# Patient Record
Sex: Male | Born: 1950 | Race: White | Hispanic: No | Marital: Married | Smoking: Former smoker
Health system: Southern US, Community
[De-identification: ages and names within clinical notes are randomized; demographics above are authoritative.]

## PROBLEM LIST (undated history)

## (undated) DIAGNOSIS — K219 Gastro-esophageal reflux disease without esophagitis: Secondary | ICD-10-CM

## (undated) DIAGNOSIS — E785 Hyperlipidemia, unspecified: Secondary | ICD-10-CM

## (undated) DIAGNOSIS — I10 Essential (primary) hypertension: Secondary | ICD-10-CM

## (undated) HISTORY — PX: TONSILLECTOMY: SUR1361

## (undated) HISTORY — PX: INGUINAL HERNIA REPAIR: SHX194

## (undated) HISTORY — DX: Hyperlipidemia, unspecified: E78.5

---

## 2005-11-30 ENCOUNTER — Ambulatory Visit (HOSPITAL_COMMUNITY): Admission: RE | Admit: 2005-11-30 | Discharge: 2005-11-30 | Payer: Self-pay | Admitting: Gastroenterology

## 2005-11-30 ENCOUNTER — Encounter (INDEPENDENT_AMBULATORY_CARE_PROVIDER_SITE_OTHER): Payer: Self-pay | Admitting: Specialist

## 2007-12-11 ENCOUNTER — Encounter: Admission: RE | Admit: 2007-12-11 | Discharge: 2007-12-11 | Payer: Self-pay | Admitting: Internal Medicine

## 2011-04-01 NOTE — Op Note (Signed)
Joseph Steele, Joseph Steele                  ACCOUNT NO.:  [id number]   MEDICAL RECORD NO.:  [id number]          PATIENT TYPE:  AMB   LOCATION:  ENDO                         FACILITY:  [facility]   PHYSICIAN:  Danise Edge, M.D.   DATE OF BIRTH:  November 29, 1950   DATE OF PROCEDURE:  11/30/2005  DATE OF DISCHARGE:                                 OPERATIVE REPORT   PROCEDURE:  Screening colonoscopy with polypectomy.   INDICATIONS FOR PROCEDURE:  Mr. Hines Kloss is a 60 year old male born  10-11-1951. Mr. Ellingson is scheduled to undergo his first screening  colonoscopy with polypectomy to prevent colon cancer.   ENDOSCOPIST:  Danise Edge, M.D.   PREMEDICATION:  Versed 7 mg, Demerol 70 mg.   PROCEDURE:  After obtaining informed consent, Mr. Snellings was placed in the  left lateral decubitus position. I administered intravenous Demerol and  intravenous Versed to achieve conscious sedation for the procedure. The  patient's blood pressure, oxygen saturation and cardiac rhythm were  monitored throughout the procedure and documented in the medical record.   Anal inspection and digital rectal exam were normal. The prostate was non-  nodular. The Olympus adjustable pediatric colonoscope was introduced into  the rectum and advanced to the cecum. A normal-appearing ileocecal valve and  appendiceal orifice were identified. Colonic preparation exam today was  excellent.   Rectum normal. I was unable to perform a retroflexed view of the distal  rectum.  Sigmoid colon and descending colon. At 45 cm from the anal verge a 2 mm  sessile polyp was removed with the cold biopsy forceps.  Splenic flexure normal.  Transverse colon normal.  Hepatic flexure normal.  Ascending colon normal.  Cecum and ileocecal valve. From the base of the cecum, a 5 mm sessile polyp  was removed with electrocautery snare.   ASSESSMENT:  A 5 mm polyp was removed from the base of the cecum and a 2 mm  polyp removed from the sigmoid  colon.           ______________________________  Danise Edge, M.D.     MJ/MEDQ  D:  11/30/2005  T:  11/30/2005  Job:  [id number]   cc:   Theressa Millard, M.D.  Fax: [phone]

## 2011-07-04 ENCOUNTER — Other Ambulatory Visit: Payer: Self-pay | Admitting: Dermatology

## 2011-07-11 ENCOUNTER — Other Ambulatory Visit: Payer: Self-pay | Admitting: Dermatology

## 2012-08-04 ENCOUNTER — Encounter (HOSPITAL_COMMUNITY): Payer: Self-pay | Admitting: Emergency Medicine

## 2012-08-04 ENCOUNTER — Emergency Department (HOSPITAL_COMMUNITY)
Admission: EM | Admit: 2012-08-04 | Discharge: 2012-08-04 | Disposition: A | Discharge status: Home / Self Care | Payer: 59 | Attending: Emergency Medicine | Admitting: Emergency Medicine

## 2012-08-04 ENCOUNTER — Emergency Department (HOSPITAL_COMMUNITY): Payer: 59

## 2012-08-04 DIAGNOSIS — E119 Type 2 diabetes mellitus without complications: Secondary | ICD-10-CM | POA: Insufficient documentation

## 2012-08-04 DIAGNOSIS — S61209A Unspecified open wound of unspecified finger without damage to nail, initial encounter: Secondary | ICD-10-CM | POA: Insufficient documentation

## 2012-08-04 DIAGNOSIS — S62609B Fracture of unspecified phalanx of unspecified finger, initial encounter for open fracture: Secondary | ICD-10-CM

## 2012-08-04 DIAGNOSIS — Y93H2 Activity, gardening and landscaping: Secondary | ICD-10-CM | POA: Insufficient documentation

## 2012-08-04 DIAGNOSIS — W298XXA Contact with other powered powered hand tools and household machinery, initial encounter: Secondary | ICD-10-CM | POA: Insufficient documentation

## 2012-08-04 DIAGNOSIS — I10 Essential (primary) hypertension: Secondary | ICD-10-CM | POA: Insufficient documentation

## 2012-08-04 DIAGNOSIS — Y92009 Unspecified place in unspecified non-institutional (private) residence as the place of occurrence of the external cause: Secondary | ICD-10-CM | POA: Insufficient documentation

## 2012-08-04 DIAGNOSIS — Z79899 Other long term (current) drug therapy: Secondary | ICD-10-CM | POA: Insufficient documentation

## 2012-08-04 HISTORY — DX: Essential (primary) hypertension: I10

## 2012-08-04 MED ORDER — LIDOCAINE HCL 2 % IJ SOLN
5.0000 mL | Freq: Once | INTRAMUSCULAR | Status: AC
  Administered 2012-08-04: 20 mg
  Filled 2012-08-04: qty 20

## 2012-08-04 MED ORDER — BACITRACIN 500 UNIT/GM EX OINT
1.0000 "application " | TOPICAL_OINTMENT | Freq: Two times a day (BID) | CUTANEOUS | Status: DC
  Administered 2012-08-04: 1 via TOPICAL
  Filled 2012-08-04: qty 0.9

## 2012-08-04 MED ORDER — CEPHALEXIN 500 MG PO CAPS: 1000.0000 mg | ORAL_CAPSULE | Freq: Two times a day (BID) | ORAL | Status: DC

## 2012-08-04 MED ORDER — TETANUS-DIPHTH-ACELL PERTUSSIS 5-2.5-18.5 LF-MCG/0.5 IM SUSP
0.5000 mL | Freq: Once | INTRAMUSCULAR | Status: AC
  Administered 2012-08-04: 0.5 mL via INTRAMUSCULAR
  Filled 2012-08-04: qty 0.5

## 2012-08-04 MED ORDER — CEFAZOLIN SODIUM 1-5 GM-% IV SOLN
1.0000 g | Freq: Once | INTRAVENOUS | Status: AC
  Administered 2012-08-04: 1 g via INTRAVENOUS
  Filled 2012-08-04: qty 50

## 2012-08-04 MED ORDER — TRAMADOL HCL 50 MG PO TABS: 50.0000 mg | ORAL_TABLET | Freq: Four times a day (QID) | ORAL | Status: DC | PRN

## 2012-08-04 NOTE — ED Notes (Signed)
Pt trimming hedges this a.m. And sustained laceration to left long finger. Went to [facility] and was sent here for care. Laceration is thru the entire nail bed

## 2012-08-04 NOTE — ED Provider Notes (Signed)
History     CSN: [id number]  Arrival date & time 08/04/12  1408   First MD Initiated Contact with Patient 08/04/12 1529      Chief Complaint  Patient presents with  . Extremity Laceration    (Consider location/radiation/quality/duration/timing/severity/associated sxs/prior treatment) The history is provided by the patient.   61 y.o. male in no acute distress presenting with left middle finger laceration sustained this a.m. after he his hand on a hedge trimmer. Patient is unsure of his last tetanus shot. Bleeding is controlled. Pain is minimal at 3/10. He reports no loss of range of motion. He reports he was holding the hedge trimmer in his left hand (he is left-hand dominant and has carpal tunnel) he was holding the hedge trimmer single handed and it slipped.    Past Medical History  Diagnosis Date  . Diabetes mellitus   . Hypertension     Past Surgical History  Procedure Date  . Tonsillectomy   . Inguinal hernia repair     right at 61 years of age    No family history on file.  History  Substance Use Topics  . Smoking status: Former Games developer  . Smokeless tobacco: Never Used  . Alcohol Use: 8.0 oz/week    16 drink(s) per week     [id number]      Review of Systems  Constitutional: Negative for fever.  Respiratory: Negative for shortness of breath.   Cardiovascular: Negative for chest pain.  Gastrointestinal: Negative for nausea, vomiting, abdominal pain and diarrhea.  All other systems reviewed and are negative.    Allergies  Review of patient's allergies indicates no known allergies.  Home Medications   Current Outpatient Rx  Name Route Sig Dispense Refill  . AMLODIPINE BESYLATE 10 MG PO TABS Oral Take 10 mg by mouth daily.    Marland Kitchen HYDROCHLOROTHIAZIDE 25 MG PO TABS Oral Take 12.5 mg by mouth daily.    Marland Kitchen LISINOPRIL 40 MG PO TABS Oral Take 40 mg by mouth daily.    Marland Kitchen METFORMIN HCL 500 MG PO TABS Oral Take 500 mg by mouth 2 (two) times daily with a meal.    .  OMEPRAZOLE MAGNESIUM 20 MG PO TBEC Oral Take 20 mg by mouth daily.    Marland Kitchen SIMVASTATIN 40 MG PO TABS Oral Take 20 mg by mouth every morning.    . CEPHALEXIN 500 MG PO CAPS Oral Take 2 capsules (1,000 mg total) by mouth 2 (two) times daily. 28 capsule 0  . TRAMADOL HCL 50 MG PO TABS Oral Take 1 tablet (50 mg total) by mouth every 6 (six) hours as needed for pain. 15 tablet 0    BP 143/82  Pulse 71  Temp 98.1 F (36.7 C) (Oral)  SpO2 99%  Physical Exam  Nursing note and vitals reviewed. Constitutional: He is oriented to person, place, and time. He appears well-developed and well-nourished. No distress.  HENT:  Head: Normocephalic.  Eyes: Conjunctivae normal and EOM are normal.  Cardiovascular: Normal rate.   Pulmonary/Chest: Effort normal. No stridor.  Abdominal: Soft. Bowel sounds are normal.  Musculoskeletal: Normal range of motion.       Left third digit distal phalanx. Partial avulsion approximately 50% to distal phalanx through the nailbed with partial thickness laceration proximal to avulsion.  Neurological: He is alert and oriented to person, place, and time.  Skin: Skin is warm.       Left third digit distal phalanx. Partial avulsion approximately 50% to distal phalanx through the  nailbed with partial thickness laceration proximal to avulsion.  Patient has full range of motion to all joints in isolation of the left third digit.  Distal sensation grossly intact but reduced.  Psychiatric: He has a normal mood and affect.    ED Course  Procedures (including critical care time)  LACERATION REPAIR Performed by: Wynetta Emery Authorized by: Wynetta Emery Consent: Verbal consent obtained. Risks and benefits: risks, benefits and alternatives were discussed Consent given by: patient Patient identity confirmed: provided demographic data Prepped and Draped in normal sterile fashion Wound explored  Laceration Location: Left third distal phalanx  Laceration Length: 2  cm  No Foreign Bodies seen or palpated  Anesthesia: Digital block   Local anesthetic: lidocaine 2% without epinephrine  Anesthetic total: 4 ml  Irrigation method: syringe Amount of cleaning: standard  Skin closure: 3-0 polypropylene   Number of sutures: 2   Technique: Simple interrupted   Patient tolerance: Patient tolerated the procedure well with no immediate complications.   Labs Reviewed - No data to display Dg Hand Complete Left  08/04/2012  *RADIOLOGY REPORT*  Clinical Data: Laceration to the distal left middle finger.  LEFT HAND - COMPLETE 3+ VIEW  Comparison: No priors.  Findings: There is a soft tissue defect overlying the distal phalanx of the middle finger.  The distal phalanx itself appears mildly fragmented, compatible with a nondisplaced comminuted fracture.  Overlying soft tissues are swollen.  The remainder of the visualized bones otherwise appear intact.  No unexpected retained radiopaque foreign body within soft tissues is identified.  IMPRESSION: 1.  Mildly comminuted nondisplaced open tuft fracture of the third distal phalanx of the left hand.   Original Report Authenticated By: Florencia Reasons, M.D.      1. Phalanx, hand fracture, open       MDM  Patient with open fracture to left third distal phalanx. Bone is not exposed however, laceration likely extends to bone. He was given a gram of Ancef.   Dr. Effie Shy consult a hand surgeon Dr. Izora Ribas who recommends closure and followup early next week.  Copious irrigation with normal saline. Wound was closed with 2 sutures using 3-0 polypropylene. Patient will follow with Dr. Izora Ribas early next week.  New Prescriptions   CEPHALEXIN (KEFLEX) 500 MG CAPSULE    Take 2 capsules (1,000 mg total) by mouth 2 (two) times daily.   TRAMADOL (ULTRAM) 50 MG TABLET    Take 1 tablet (50 mg total) by mouth every 6 (six) hours as needed for pain.          Wynetta Emery, PA-C 08/04/12 2127

## 2012-08-04 NOTE — Discharge Instructions (Signed)
Take your antibiotics as directed and call Dr. Izora Ribas to make an appointment on Monday morning. Keep the wound dry for the first 24 hours then after that wash daily with soap and water and apply bacitracin keep the wound covered. Reapply the finger splint and buddy tape after you clean the wound. Do not apply any caustic material to the wound like rubbing alcohol or hydrogen peroxide.  Finger Fracture (Phalangeal) A broken bone of the finger (phalangealfracture) is a common injury for athletes. A single injury (trauma) is likely to fracture multiple bones on the same or different fingers. SYMPTOMS   Severe pain, at the time of injury.   Pain, tenderness, swelling, and later bruising of the finger and then the hand.   Visible deformity, if the fracture is complete and the bone fragments separate enough to distort the normal shape.   Numbness or coldness from swelling in the finger, causing pressure on blood vessels or nerves (uncommon).  CAUSES  Direct or indirect injury (trauma) to the finger.  RISK INCREASES WITH:   Contact sports (football, rugby) or other sports where injury to the hand is likely (soccer, baseball, basketball).   Sports that require hitting (boxing, martial arts).   History of bone or joint disease, such as osteoporosis, or previous bone restraint.   Poor hand strength and flexibility.  PREVENTION   For contact sports, wear appropriate and properly fitted protective equipment for the hand.   Learn and use proper technique when hitting, punching, or landing after a fall.   If you had a previous finger injury or hand restraint, use tape or padding to protect the finger when playing sports where finger injury is likely.  PROGNOSIS  With proper treatment and normal alignment of the bones, healing can usually be expected in 4 to 6 weeks. Sometimes, surgery is needed.  RELATED COMPLICATIONS   Fracture does not heal (nonunion).   Bone heals in wrong position  (malunion).   Chronic pain, stiffness, or swelling of the hand.   Excessive bleeding, causing pressure on nerves and blood vessels.   Unstable or arthritic joint, following repeated injury or delayed treatment.   Hindrance of normal growth in children.   Infection in skin broken over the fracture (open fracture) or at the incision or pin sites from surgery.   Shortening of injured bones.   Bony bumps or loss of shape of the fingers.   Arthritic or stiff finger joint, if the fracture reaches the joint.  TREATMENT  If the bones are properly aligned, treatment involves ice and medicine to reduce pain and inflammation. Then, the finger is restrained for 4 or more weeks, to allow for healing. If the fracture is out of alignment (displaced), involves more than one bone, or involves a joint, surgery is usually advised. Surgery often involves placing removable pins, screws, and sometimes plates, to hold the bones in proper alignment. After restraint (with or without surgery), stretching and strengthening exercises are needed. Exercises may be completed at home or with a therapist. For certain sports, wearing a splint or having the finger taped during future activity is advised.  MEDICATION   If pain medicine is needed, nonsteroidal anti-inflammatory medicines (aspirin and ibuprofen), or other minor pain relievers (acetaminophen), are often advised.   Do not take pain medicine for 7 days before surgery.   Prescription pain relievers are usually prescribed only after surgery. Use only as directed and only as much as you need.  COLD THERAPY   Cold treatment (icing)  relieves pain and reduces inflammation. Cold treatment should be applied for 10 to 15 minutes every 2 to 3 hours, and immediately after activity that aggravates your symptoms. Use ice packs or an ice massage.  SEEK MEDICAL CARE IF:   Pain, tenderness, or swelling gets worse, despite treatment.   You experience pain, numbness, or  coldness in the hand.   Blue, gray, or dark color appears in the fingernails.   Any of the following occur after surgery: fever, increased pain, swelling, redness, drainage of fluids, or bleeding in the affected area.   New, unexplained symptoms develop. (Drugs used in treatment may produce side effects.)  Document Released: 10/31/2005 Document Revised: 10/20/2011 Document Reviewed: 02/12/2009 [facility] Patient Information 2012 [place], [place].

## 2012-08-04 NOTE — ED Provider Notes (Signed)
Joseph Steele is a 61 y.o. male who cut his left middle finger with a hedge trimmer. His gait. Laceration medial distal tip extending to the nail with a 50% amputation, and laxity of the distal fragment. He appears to intact distal capillary refill. He has good function of the finger. There is mild bleeding  Consultation with hand surgery: Dr. Izora Ribas, recommends, primary closure by ED providers, leaving nail fragment on, and closing skin only. He will see pt in office on Monday for a check up.      Medical screening examination/treatment/procedure(s) were conducted as a shared visit with non-physician practitioner(s) and myself.  I personally evaluated the patient during the encounter  Flint Melter, MD 08/07/12 1806

## 2012-10-09 ENCOUNTER — Other Ambulatory Visit: Payer: Self-pay | Admitting: Dermatology

## 2013-01-29 ENCOUNTER — Other Ambulatory Visit: Payer: Self-pay | Admitting: Gastroenterology

## 2013-09-02 ENCOUNTER — Ambulatory Visit: Payer: Self-pay

## 2016-02-03 ENCOUNTER — Other Ambulatory Visit: Payer: Self-pay | Admitting: Gastroenterology

## 2016-02-08 ENCOUNTER — Encounter (HOSPITAL_COMMUNITY): Payer: Self-pay | Admitting: *Deleted

## 2016-02-16 ENCOUNTER — Ambulatory Visit (HOSPITAL_COMMUNITY): Payer: Medicare Other | Admitting: Anesthesiology

## 2016-02-16 ENCOUNTER — Encounter (HOSPITAL_COMMUNITY): Payer: Self-pay

## 2016-02-16 ENCOUNTER — Encounter (HOSPITAL_COMMUNITY)
Admission: RE | Disposition: A | Discharge status: Home / Self Care | Payer: Self-pay | Source: Ambulatory Visit | Attending: Gastroenterology

## 2016-02-16 ENCOUNTER — Ambulatory Visit (HOSPITAL_COMMUNITY)
Admission: RE | Admit: 2016-02-16 | Discharge: 2016-02-16 | Disposition: A | Discharge status: Home / Self Care | Payer: Medicare Other | Source: Ambulatory Visit | Attending: Gastroenterology | Admitting: Gastroenterology

## 2016-02-16 DIAGNOSIS — Z87891 Personal history of nicotine dependence: Secondary | ICD-10-CM | POA: Insufficient documentation

## 2016-02-16 DIAGNOSIS — E119 Type 2 diabetes mellitus without complications: Secondary | ICD-10-CM | POA: Insufficient documentation

## 2016-02-16 DIAGNOSIS — K219 Gastro-esophageal reflux disease without esophagitis: Secondary | ICD-10-CM | POA: Diagnosis not present

## 2016-02-16 DIAGNOSIS — D120 Benign neoplasm of cecum: Secondary | ICD-10-CM | POA: Insufficient documentation

## 2016-02-16 DIAGNOSIS — I10 Essential (primary) hypertension: Secondary | ICD-10-CM | POA: Diagnosis not present

## 2016-02-16 DIAGNOSIS — Z1211 Encounter for screening for malignant neoplasm of colon: Secondary | ICD-10-CM | POA: Insufficient documentation

## 2016-02-16 DIAGNOSIS — J309 Allergic rhinitis, unspecified: Secondary | ICD-10-CM | POA: Insufficient documentation

## 2016-02-16 DIAGNOSIS — E7800 Pure hypercholesterolemia, unspecified: Secondary | ICD-10-CM | POA: Insufficient documentation

## 2016-02-16 HISTORY — PX: COLONOSCOPY WITH PROPOFOL: SHX5780

## 2016-02-16 HISTORY — DX: Gastro-esophageal reflux disease without esophagitis: K21.9

## 2016-02-16 LAB — GLUCOSE, CAPILLARY: Glucose-Capillary: 125 mg/dL — ABNORMAL HIGH (ref 65–99)

## 2016-02-16 SURGERY — COLONOSCOPY WITH PROPOFOL
Anesthesia: Monitor Anesthesia Care

## 2016-02-16 MED ORDER — PROPOFOL 500 MG/50ML IV EMUL
INTRAVENOUS | Status: DC | PRN
  Administered 2016-02-16: 130 ug/kg/min via INTRAVENOUS

## 2016-02-16 MED ORDER — SODIUM CHLORIDE 0.9 % IV SOLN: INTRAVENOUS | Status: DC

## 2016-02-16 MED ORDER — LIDOCAINE HCL (CARDIAC) 20 MG/ML IV SOLN
INTRAVENOUS | Status: AC
  Filled 2016-02-16: qty 5

## 2016-02-16 MED ORDER — PROPOFOL 10 MG/ML IV BOLUS
INTRAVENOUS | Status: DC | PRN
  Administered 2016-02-16 (×4): 20 mg via INTRAVENOUS

## 2016-02-16 MED ORDER — LIDOCAINE HCL (CARDIAC) 20 MG/ML IV SOLN
INTRAVENOUS | Status: DC | PRN
  Administered 2016-02-16: 100 mg via INTRAVENOUS

## 2016-02-16 MED ORDER — LACTATED RINGERS IV SOLN
INTRAVENOUS | Status: DC
  Administered 2016-02-16: 1000 mL via INTRAVENOUS

## 2016-02-16 MED ORDER — PROPOFOL 10 MG/ML IV BOLUS
INTRAVENOUS | Status: AC
  Filled 2016-02-16: qty 40

## 2016-02-16 MED ORDER — PROPOFOL 10 MG/ML IV BOLUS
INTRAVENOUS | Status: AC
  Filled 2016-02-16: qty 20

## 2016-02-16 SURGICAL SUPPLY — 22 items

## 2016-02-16 NOTE — H&P (Signed)
  Procedure: Surveillance colonoscopy. History of adenomatous colon polyps removed colonoscopically in the past  History: The patient is a 65 year old male born 12/22/1950. He is scheduled to undergo a surveillance colonoscopy today.  Past medical history: Hypertension. Hypercholesterolemia. Allergic rhinitis. Gastroesophageal reflux. Type 2 diabetes mellitus. Lumbar radiculopathy. Tonsillectomy. Herniorrhaphy.  Medication allergies: None  Exam: Patient is alert and lying comfortably on the endoscopy stretcher. Abdomen is soft and nontender to palpation. Lungs are clear to auscultation. Cardiac exam reveals a regular rhythm.  Plan: Proceed with surveillance colonoscopy

## 2016-02-16 NOTE — Anesthesia Preprocedure Evaluation (Addendum)
Anesthesia Evaluation  Patient identified by MRN, date of birth, ID band Patient awake    Reviewed: Allergy & Precautions, NPO status , Patient's Chart, lab work & pertinent test results  Airway Mallampati: II  TM Distance: >3 FB Neck ROM: Full    Dental   Pulmonary former smoker,    breath sounds clear to auscultation       Cardiovascular hypertension, Pt. on medications  Rhythm:Regular Rate:Normal     Neuro/Psych negative neurological ROS     GI/Hepatic Neg liver ROS, GERD  ,  Endo/Other  diabetes, Type 2  Renal/GU negative Renal ROS     Musculoskeletal   Abdominal   Peds  Hematology negative hematology ROS (+)   Anesthesia Other Findings   Reproductive/Obstetrics                            Anesthesia Physical Anesthesia Plan  ASA: II  Anesthesia Plan: MAC   Post-op Pain Management:    Induction: Intravenous  Airway Management Planned: Natural Airway, Nasal Cannula and Simple Face Mask  Additional Equipment:   Intra-op Plan:   Post-operative Plan:   Informed Consent: I have reviewed the patients History and Physical, chart, labs and discussed the procedure including the risks, benefits and alternatives for the proposed anesthesia with the patient or authorized representative who has indicated his/her understanding and acceptance.     Plan Discussed with: CRNA  Anesthesia Plan Comments:         Anesthesia Quick Evaluation

## 2016-02-16 NOTE — Op Note (Signed)
[facility] Patient Name: Joseph Steele Procedure Date: 02/16/2016 MRN: [id number] Attending MD: Garlan Fair , MD Date of Birth: Feb 22, 1951 CSN:  Age: 65 Admit Type: Outpatient Procedure:                Colonoscopy Indications:              High risk colon cancer surveillance: Personal                            history of adenoma less than 10 mm in size Providers:                Garlan Fair, MD, Hilma Favors, RN, Alfonso Patten, Technician, Carleene Cooper, CRNA Referring MD:              Medicines:                Propofol per Anesthesia Complications:            No immediate complications. Estimated Blood Loss:     Estimated blood loss: none. Procedure:                Pre-Anesthesia Assessment:                           - Prior to the procedure, a History and Physical                            was performed, and patient medications and                            allergies were reviewed. The patient's tolerance of                            previous anesthesia was also reviewed. The risks                            and benefits of the procedure and the sedation                            options and risks were discussed with the patient.                            All questions were answered, and informed consent                            was obtained. Prior Anticoagulants: The patient has                            taken aspirin, last dose was day of procedure. ASA                            Grade Assessment: II - A patient with mild systemic  disease. After reviewing the risks and benefits,                            the patient was deemed in satisfactory condition to                            undergo the procedure.                           After obtaining informed consent, the colonoscope                            was passed under direct vision. Throughout the                            procedure, the  patient's blood pressure, pulse, and                            oxygen saturations were monitored continuously. The                            [id number]) scope was introduced through                            the anus and advanced to the the cecum, identified                            by appendiceal orifice and ileocecal valve. The                            colonoscopy was performed without difficulty. The                            patient tolerated the procedure well. The quality                            of the bowel preparation was good. The appendiceal                            orifice and the rectum were photographed. Scope In: 9:52:38 AM Scope Out: 10:22:15 AM Scope Withdrawal Time: 0 hours 20 minutes 10 seconds  Total Procedure Duration: 0 hours 29 minutes 37 seconds  Findings:      The perianal and digital rectal examinations were normal.      A 3 mm polyp was found in the cecum. The polyp was sessile. The polyp       was removed with a cold biopsy forceps. Resection and retrieval were       complete.      A 3 mm polyp was found in the cecum. The polyp was sessile. The polyp       was removed with a cold biopsy forceps. Resection and retrieval were       complete.      A 5 mm polyp was found in the mid ascending colon. The polyp was       sessile.  The polyp was removed with a cold snare. Resection and       retrieval were complete.      A 5 mm polyp was found in the mid transverse colon. The polyp was       sessile. The polyp was removed with a cold snare. Resection and       retrieval were complete.      A 5 mm polyp was found in the distal descending colon. The polyp was       sessile. The polyp was removed with a cold snare. Resection and       retrieval were complete.      The exam was otherwise without abnormality. Impression:               - One 3 mm polyp in the cecum, removed with a cold                            biopsy forceps. Resected and  retrieved.                           - One 3 mm polyp in the cecum, removed with a cold                            biopsy forceps. Resected and retrieved.                           - One 5 mm polyp in the mid ascending colon,                            removed with a cold snare. Resected and retrieved.                           - One 5 mm polyp in the mid transverse colon,                            removed with a cold snare. Resected and retrieved.                           - One 5 mm polyp in the distal descending colon,                            removed with a cold snare. Resected and retrieved.                           - The examination was otherwise normal. Moderate Sedation:      N/A- Per Anesthesia Care Recommendation:           - Repeat colonoscopy date to be determined after                            pending pathology results are reviewed for                            surveillance.                           -  Patient has a contact number available for                            emergencies. The signs and symptoms of potential                            delayed complications were discussed with the                            patient. Return to normal activities tomorrow.                            Written discharge instructions were provided to the                            patient.                           - Resume previous diet.                           - Continue present medications. Procedure Code(s):        --- Professional ---                           [phone], Colonoscopy, flexible; with removal of                            tumor(s), polyp(s), or other lesion(s) by snare                            technique                           45380, 70, Colonoscopy, flexible; with biopsy,                            single or multiple Diagnosis Code(s):        --- Professional ---                           Z86.010, Personal history of colonic polyps                            D12.0, Benign neoplasm of cecum                           D12.2, Benign neoplasm of ascending colon                           D12.3, Benign neoplasm of transverse colon (hepatic                            flexure or splenic flexure)                           D12.4, Benign neoplasm of descending  colon CPT copyright 2016 American Medical Association. All rights reserved. The codes documented in this report are preliminary and upon coder review may  be revised to meet current compliance requirements. Earle Gell, MD Garlan Fair, MD 02/16/2016 10:33:05 AM This report has been signed electronically. Number of Addenda: 0

## 2016-02-16 NOTE — Discharge Instructions (Signed)

## 2016-02-16 NOTE — Transfer of Care (Signed)
Immediate Anesthesia Transfer of Care Note  Patient: Joseph Steele  Procedure(s) Performed: Procedure(s): COLONOSCOPY WITH PROPOFOL (N/A)  Patient Location: PACU and Endoscopy Unit  Anesthesia Type:MAC  Level of Consciousness: awake, alert , oriented and patient cooperative  Airway & Oxygen Therapy: Patient Spontanous Breathing and Patient connected to face mask oxygen  Post-op Assessment: Report given to RN, Post -op Vital signs reviewed and stable and Patient moving all extremities  Post vital signs: Reviewed and stable  Last Vitals:  Filed Vitals:   02/16/16 0804  BP: 189/87  Pulse: 69  Temp: 37 C  Resp: 14    Complications: No apparent anesthesia complications

## 2016-02-16 NOTE — Anesthesia Postprocedure Evaluation (Signed)
Anesthesia Post Note  Patient: Joseph Steele  Procedure(s) Performed: Procedure(s) (LRB): COLONOSCOPY WITH PROPOFOL (N/A)  Patient location during evaluation: PACU Anesthesia Type: MAC Level of consciousness: awake and alert Pain management: pain level controlled Vital Signs Assessment: post-procedure vital signs reviewed and stable Respiratory status: spontaneous breathing, nonlabored ventilation, respiratory function stable and patient connected to nasal cannula oxygen Cardiovascular status: stable and blood pressure returned to baseline Anesthetic complications: no    Last Vitals:  Filed Vitals:   02/16/16 1040 02/16/16 1050  BP: 154/76 177/90  Pulse: 69 63  Temp:    Resp: 15 13    Last Pain: There were no vitals filed for this visit.               Tiajuana Amass

## 2016-02-17 ENCOUNTER — Encounter (HOSPITAL_COMMUNITY): Payer: Self-pay | Admitting: Gastroenterology

## 2016-02-18 ENCOUNTER — Encounter (HOSPITAL_COMMUNITY): Payer: Self-pay | Admitting: Gastroenterology

## 2018-03-13 ENCOUNTER — Other Ambulatory Visit: Payer: Self-pay | Admitting: Internal Medicine

## 2018-03-13 DIAGNOSIS — I10 Essential (primary) hypertension: Secondary | ICD-10-CM

## 2018-03-26 ENCOUNTER — Ambulatory Visit
Admission: RE | Admit: 2018-03-26 | Discharge: 2018-03-26 | Disposition: A | Discharge status: Home / Self Care | Payer: Medicare Other | Source: Ambulatory Visit | Attending: Internal Medicine | Admitting: Internal Medicine

## 2018-03-26 DIAGNOSIS — I10 Essential (primary) hypertension: Secondary | ICD-10-CM

## 2019-09-16 NOTE — Progress Notes (Signed)
Patient referred by [facility], Carlyle Basques* for leg edema  Subjective:   Joseph Steele, male    DOB: Mar 14, 1951, 68 y.o.   MRN: [id number]   Chief Complaint  Patient presents with  . Leg Swelling  . Weight Gain  . New Patient (Initial Visit)    HPI  68 y.o. Caucasian male with hypertension, type 2 DM, leg edema.  Patient is a retired Veterinary surgeon, and still stays active with work around [employer].  Since July 2020, he has noticed worsening bilateral leg swelling.  He denies any shortness of breath, orthopnea, PND, chest pain symptoms.  Also denies any presyncope or syncope.  He was recently started on Lasix 20 mg once daily.  He has not noticed any significant change in his symptoms.     Past Medical History:  Diagnosis Date  . Diabetes mellitus   . GERD (gastroesophageal reflux disease)   . Hypertension      Past Surgical History:  Procedure Laterality Date  . COLONOSCOPY WITH PROPOFOL N/A 02/16/2016   Procedure: COLONOSCOPY WITH PROPOFOL;  Surgeon: Garlan Fair, MD;  Location: [facility];  Service: Endoscopy;  Laterality: N/A;  . INGUINAL HERNIA REPAIR     right at 68 years of age  . TONSILLECTOMY       Social History   Socioeconomic History  . Marital status: Married    Spouse name: Not on file  . Number of children: Not on file  . Years of education: Not on file  . Highest education level: Not on file  Occupational History  . Not on file  Social Needs  . Financial resource strain: Not on file  . Food insecurity    Worry: Not on file    Inability: Not on file  . Transportation needs    Medical: Not on file    Non-medical: Not on file  Tobacco Use  . Smoking status: Former Smoker    Types: Cigarettes  . Smokeless tobacco: Never Used  Substance and Sexual Activity  . Alcohol use: Yes    Alcohol/week: 16.0 standard drinks    Types: 16 Standard drinks or equivalent per week    Comment: [id number]  . Drug use: No  . Sexual activity: Not  on file  Lifestyle  . Physical activity    Days per week: Not on file    Minutes per session: Not on file  . Stress: Not on file  Relationships  . Social Herbalist on phone: Not on file    Gets together: Not on file    Attends religious service: Not on file    Active member of club or organization: Not on file    Attends meetings of clubs or organizations: Not on file    Relationship status: Not on file  . Intimate partner violence    Fear of current or ex partner: Not on file    Emotionally abused: Not on file    Physically abused: Not on file    Forced sexual activity: Not on file  Other Topics Concern  . Not on file  Social History Narrative  . Not on file     Family History  Problem Relation Age of Onset  . Stroke Mother   . Hypertension Mother   . Hypertension Father   . Congestive Heart Failure Father   . Diabetes Maternal Grandmother   . Diabetes Maternal Grandfather   . Diabetes Paternal Grandmother   . Diabetes Paternal  Grandfather      Current Outpatient Medications on File Prior to Visit  Medication Sig Dispense Refill  . acetaminophen (TYLENOL) 500 MG tablet Take 500 mg by mouth every 6 (six) hours as needed for moderate pain.    Marland Kitchen amLODipine (NORVASC) 10 MG tablet Take 10 mg by mouth every morning.     Marland Kitchen buPROPion (WELLBUTRIN XL) 150 MG 24 hr tablet Take 150 mg by mouth daily.    . fluticasone (FLONASE) 50 MCG/ACT nasal spray Place 2 sprays into both nostrils every morning.    . furosemide (LASIX) 20 MG tablet Take 20 mg by mouth as directed. 2 in morning, 1 in early afternoon    . hydrALAZINE (APRESOLINE) 25 MG tablet Take 25 mg by mouth 2 (two) times daily.    . metFORMIN (GLUCOPHAGE) 500 MG tablet Take 500 mg by mouth 2 (two) times daily with a meal. 2 in am , 2 in evening    . metoprolol tartrate (LOPRESSOR) 50 MG tablet Take 50 mg by mouth daily.    Marland Kitchen olmesartan-hydrochlorothiazide (BENICAR HCT) 40-25 MG tablet Take 1 tablet by mouth  daily.    Marland Kitchen omeprazole (PRILOSEC OTC) 20 MG tablet Take 20 mg by mouth daily.     . potassium chloride (KLOR-CON) 10 MEQ tablet Take 10 mEq by mouth 2 (two) times daily.    . simvastatin (ZOCOR) 40 MG tablet Take 20 mg by mouth every morning.    . tamsulosin (FLOMAX) 0.4 MG CAPS capsule Take 0.4 mg by mouth daily.    Marland Kitchen triamcinolone cream (KENALOG) 0.1 % Apply 1 application topically daily as needed (eczema).     No current facility-administered medications on file prior to visit.     Cardiovascular studies:  EKG 09/17/2019: Sinus rhythm 72 bpm. Short PR interval. Cannot exclude old inferior infarct.   Recent labs: 06/14/2019: Glucose 121. BUN/Cr 10/1.07. eGFR 68. Na/K 130/3.7. Rest of the CMP normal.  H/H 12.7/37.2. MCV 82. Platelets 224 Chol 169, TG 188, HDL 66, LDL 65.  HbA1C 7.0%  Review of Systems  Constitution: Negative for decreased appetite, malaise/fatigue, weight gain and weight loss.  HENT: Negative for congestion.   Eyes: Negative for visual disturbance.  Cardiovascular: Positive for leg swelling. Negative for chest pain, dyspnea on exertion, palpitations and syncope.  Respiratory: Negative for cough.   Endocrine: Negative for cold intolerance.  Hematologic/Lymphatic: Does not bruise/bleed easily.  Skin: Negative for itching and rash.  Musculoskeletal: Negative for myalgias.  Gastrointestinal: Negative for abdominal pain, nausea and vomiting.  Genitourinary: Negative for dysuria.  Neurological: Negative for dizziness and weakness.  Psychiatric/Behavioral: The patient is not nervous/anxious.   All other systems reviewed and are negative.        Vitals:   09/17/19 1044  BP: (!) 149/69  Pulse: 79  SpO2: 95%     Body mass index is 30.29 kg/m. Filed Weights   09/17/19 1044  Weight: 193 lb 6.4 oz (87.7 kg)     Objective:   Physical Exam  Constitutional: He is oriented to person, place, and time. He appears well-developed and well-nourished. No  distress.  HENT:  Head: Normocephalic and atraumatic.  Eyes: Pupils are equal, round, and reactive to light. Conjunctivae are normal.  Neck: No JVD present.  Cardiovascular: Normal rate, regular rhythm and intact distal pulses.  Murmur heard.  Early systolic murmur is present with a grade of 2/6 at the upper right sternal border. Pulmonary/Chest: Effort normal and breath sounds normal. He has no wheezes.  He has no rales.  Abdominal: Soft. Bowel sounds are normal. There is no rebound.  Musculoskeletal:        General: Edema (2+ b/l) present.  Lymphadenopathy:    He has no cervical adenopathy.  Neurological: He is alert and oriented to person, place, and time. No cranial nerve deficit.  Skin: Skin is warm and dry.  Psychiatric: He has a normal mood and affect.  Nursing note and vitals reviewed.         Assessment & Recommendations:   68 y.o. Caucasian male with hypertension, type 2 DM, leg edema  Leg edema: Concerning for right heart failure symptoms. Although amlodipine 10 mg can cause edema, he has been on the stable dose for long time.  Will obtain echocardiogram.  Increase his Lasix to 40 mg in the morning, 20 mg in the afternoon.  Increase potassium to 20 MEQ daily.  Hyponatremia: Suspect dilutional hyponatremia in the setting of congestive heart failure.  Will check serum and urine osmolality.  Based on the findings, he may need to come hydrochlorothiazide.   `   Further recommendations after above testing.   Thank you for referring the patient to [place]. Please feel free to contact with any questions.  [name], MD [facility] Pager: [phone] Office: [phone] If no answer Cell [phone]

## 2019-09-17 ENCOUNTER — Ambulatory Visit (INDEPENDENT_AMBULATORY_CARE_PROVIDER_SITE_OTHER): Payer: Medicare Other | Admitting: Cardiology

## 2019-09-17 ENCOUNTER — Encounter: Payer: Self-pay | Admitting: Cardiology

## 2019-09-17 ENCOUNTER — Other Ambulatory Visit: Payer: Self-pay

## 2019-09-17 VITALS — BP 149/69 | HR 79 | Ht 67.0 in | Wt 193.4 lb

## 2019-09-17 DIAGNOSIS — E781 Pure hyperglyceridemia: Secondary | ICD-10-CM | POA: Insufficient documentation

## 2019-09-17 DIAGNOSIS — I10 Essential (primary) hypertension: Secondary | ICD-10-CM

## 2019-09-17 DIAGNOSIS — E785 Hyperlipidemia, unspecified: Secondary | ICD-10-CM | POA: Insufficient documentation

## 2019-09-17 DIAGNOSIS — E871 Hypo-osmolality and hyponatremia: Secondary | ICD-10-CM

## 2019-09-17 DIAGNOSIS — I1A0 Resistant hypertension: Secondary | ICD-10-CM | POA: Insufficient documentation

## 2019-09-18 LAB — OSMOLALITY: Osmolality Meas: 271 mOsmol/kg — ABNORMAL LOW (ref 280–301)

## 2019-09-18 LAB — OSMOLALITY, URINE: Osmolality, Ur: 261 mOsmol/kg

## 2019-09-19 ENCOUNTER — Other Ambulatory Visit: Payer: Self-pay | Admitting: Cardiology

## 2019-09-19 DIAGNOSIS — R600 Localized edema: Secondary | ICD-10-CM

## 2019-09-19 DIAGNOSIS — I10 Essential (primary) hypertension: Secondary | ICD-10-CM

## 2019-09-23 ENCOUNTER — Ambulatory Visit (INDEPENDENT_AMBULATORY_CARE_PROVIDER_SITE_OTHER): Payer: Medicare Other

## 2019-09-23 ENCOUNTER — Other Ambulatory Visit: Payer: Self-pay

## 2019-09-23 DIAGNOSIS — R600 Localized edema: Secondary | ICD-10-CM | POA: Diagnosis not present

## 2019-09-23 DIAGNOSIS — I10 Essential (primary) hypertension: Secondary | ICD-10-CM

## 2019-09-24 ENCOUNTER — Telehealth: Payer: Self-pay

## 2019-09-26 ENCOUNTER — Other Ambulatory Visit: Payer: Self-pay

## 2019-09-26 MED ORDER — FUROSEMIDE 20 MG PO TABS: 20.0000 mg | ORAL_TABLET | ORAL | 0 refills | Status: DC

## 2019-09-27 ENCOUNTER — Telehealth: Payer: Self-pay | Admitting: Cardiology

## 2019-09-27 DIAGNOSIS — I10 Essential (primary) hypertension: Secondary | ICD-10-CM

## 2019-09-27 MED ORDER — OLMESARTAN MEDOXOMIL 40 MG PO TABS: 40.0000 mg | ORAL_TABLET | Freq: Every day | ORAL | 3 refills | Status: DC

## 2019-09-27 NOTE — Telephone Encounter (Signed)
Discussed echocardiogram, serum and urine osmolality results with the patient and his wife.  Mild HFpEF with mild pulmonary hypertension and mild aortic stenosis.  Dilutional hyponatremia, as evident on serum and urine osmolality.  Recommended the following.  Stop olmesartan-HCTZ 40-25 mg daily.  Start olmesartan 40 mg daily alone.  Reduce Lasix to 20 mg 1 in the morning, 1 in the afternoon.  If blood pressure stays greater than 140/80 mmHg, as long as heart rate is greater than 60, patient can take metoprolol tartrate 50 mg twice daily.  Repeat BMP in first week of December.  Follow-up in the office on 10/24/2019.  [name], MD [facility] Pager: [phone] Office: [phone]

## 2019-10-01 ENCOUNTER — Other Ambulatory Visit: Payer: Medicare Other

## 2019-10-02 NOTE — Telephone Encounter (Signed)
error 

## 2019-10-24 ENCOUNTER — Encounter: Payer: Self-pay | Admitting: Cardiology

## 2019-10-24 ENCOUNTER — Ambulatory Visit (INDEPENDENT_AMBULATORY_CARE_PROVIDER_SITE_OTHER): Payer: Medicare Other | Admitting: Cardiology

## 2019-10-24 ENCOUNTER — Other Ambulatory Visit: Payer: Self-pay

## 2019-10-24 VITALS — BP 167/84 | Temp 98.2°F | Ht 67.0 in | Wt 187.5 lb

## 2019-10-24 DIAGNOSIS — R0683 Snoring: Secondary | ICD-10-CM | POA: Diagnosis not present

## 2019-10-24 DIAGNOSIS — I10 Essential (primary) hypertension: Secondary | ICD-10-CM | POA: Diagnosis not present

## 2019-10-24 DIAGNOSIS — R600 Localized edema: Secondary | ICD-10-CM | POA: Diagnosis not present

## 2019-10-24 MED ORDER — FUROSEMIDE 20 MG PO TABS: 40.0000 mg | ORAL_TABLET | Freq: Two times a day (BID) | ORAL | 2 refills | Status: DC

## 2019-10-24 MED ORDER — CARVEDILOL 12.5 MG PO TABS: 12.5000 mg | ORAL_TABLET | Freq: Two times a day (BID) | ORAL | 3 refills | Status: DC

## 2019-10-24 NOTE — Progress Notes (Signed)
Patient referred by [name],* for leg edema  Subjective:   Joseph Steele, male    DOB: Jan 20, 1951, 68 y.o.   MRN: [id number]   Chief Complaint  Patient presents with  . Edema    follow up    HPI  68 y.o. Caucasian male with hypertension, type 2 DM, leg edema.  Patient has lost weight, leg swelling has improved.  However, as predicted, his blood pressure is now uncontrolled.  Discussed echocardiogram findings with the patient.  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  Since July 2020, he has noticed worsening bilateral leg swelling.  He denies any shortness of breath, orthopnea, PND, chest pain symptoms.  Also denies any presyncope or syncope.  He was recently started on Lasix 20 mg once daily.  He has not noticed any significant change in his symptoms.  Tel encounter 11/13: Discussed echocardiogram, serum and urine osmolality results with the patient and his wife.  Mild HFpEF with mild pulmonary hypertension and mild aortic stenosis.  Dilutional hyponatremia, as evident on serum and urine osmolality. Recommendeded the following. Stop olmesartan-HCTZ 40-25 mg daily.  Start olmesartan 40 mg daily alone.  Reduce Lasix to 20 mg 1 in the morning, 1 in the afternoon.  If blood pressure stays greater than 140/80 mmHg, as long as heart rate is greater than 60, patient can take metoprolol tartrate 50 mg twice daily.  Repeat BMP in first week of December.  Follow-up in the office on 10/24/2019.      Past Medical History:  Diagnosis Date  . Diabetes mellitus   . GERD (gastroesophageal reflux disease)   . Hypertension      Past Surgical History:  Procedure Laterality Date  . COLONOSCOPY WITH PROPOFOL N/A 02/16/2016   Procedure: COLONOSCOPY WITH PROPOFOL;  Surgeon: Garlan Fair, MD;  Location: [facility];  Service: Endoscopy;  Laterality: N/A;  . INGUINAL HERNIA REPAIR     right at 68  years of age  . TONSILLECTOMY       Social History   Socioeconomic History  . Marital status: Married    Spouse name: Not on file  . Number of children: 2  . Years of education: Not on file  . Highest education level: Not on file  Occupational History  . Not on file  Tobacco Use  . Smoking status: Former Smoker    Packs/day: 2.00    Years: 10.00    Pack years: 20.00    Types: Cigarettes    Quit date: 1984    Years since quitting: 36.9  . Smokeless tobacco: Never Used  Substance and Sexual Activity  . Alcohol use: Yes    Alcohol/week: 16.0 standard drinks    Types: 16 Standard drinks or equivalent per week    Comment: [id number]  . Drug use: No  . Sexual activity: Not on file  Other Topics Concern  . Not on file  Social History Narrative  . Not on file   Social Determinants of Health   Financial Resource Strain:   . Difficulty of Paying Living Expenses: Not on file  Food Insecurity:   . Worried About Charity fundraiser in the Last Year: Not on file  . Ran Out of Food in the Last Year: Not on file  Transportation Needs:   . Lack of Transportation (Medical): Not on file  . Lack of Transportation (Non-Medical): Not on file  Physical Activity:   .  Days of Exercise per Week: Not on file  . Minutes of Exercise per Session: Not on file  Stress:   . Feeling of Stress : Not on file  Social Connections:   . Frequency of Communication with Friends and Family: Not on file  . Frequency of Social Gatherings with Friends and Family: Not on file  . Attends Religious Services: Not on file  . Active Member of Clubs or Organizations: Not on file  . Attends Archivist Meetings: Not on file  . Marital Status: Not on file  Intimate Partner Violence:   . Fear of Current or Ex-Partner: Not on file  . Emotionally Abused: Not on file  . Physically Abused: Not on file  . Sexually Abused: Not on file     Family History  Problem Relation Age of Onset  . Stroke Mother    . Hypertension Mother   . Hypertension Father   . Congestive Heart Failure Father   . Diabetes Maternal Grandmother   . Diabetes Maternal Grandfather   . Diabetes Paternal Grandmother   . Diabetes Paternal Grandfather      Current Outpatient Medications on File Prior to Visit  Medication Sig Dispense Refill  . acetaminophen (TYLENOL) 500 MG tablet Take 500 mg by mouth every 6 (six) hours as needed for moderate pain.    Marland Kitchen amLODipine (NORVASC) 10 MG tablet Take 10 mg by mouth every morning.     Marland Kitchen aspirin EC 81 MG tablet Take 81 mg by mouth daily.    . fluticasone (FLONASE) 50 MCG/ACT nasal spray Place 2 sprays into both nostrils every morning.    . furosemide (LASIX) 20 MG tablet Take 1 tablet (20 mg total) by mouth as directed. 2 in morning, 1 in early afternoon 90 tablet 0  . hydrALAZINE (APRESOLINE) 25 MG tablet Take 25 mg by mouth 3 (three) times daily.     . metFORMIN (GLUCOPHAGE) 500 MG tablet Take 500 mg by mouth 2 (two) times daily with a meal. 2 in am , 2 in evening    . metoprolol tartrate (LOPRESSOR) 50 MG tablet Take 50 mg by mouth daily.    Marland Kitchen olmesartan (BENICAR) 40 MG tablet Take 1 tablet (40 mg total) by mouth daily. 30 tablet 3  . omeprazole (PRILOSEC OTC) 20 MG tablet Take 20 mg by mouth daily.     . potassium chloride (KLOR-CON) 10 MEQ tablet Take 10 mEq by mouth 2 (two) times daily.    . simvastatin (ZOCOR) 40 MG tablet Take 20 mg by mouth every morning.    . tamsulosin (FLOMAX) 0.4 MG CAPS capsule Take 0.4 mg by mouth daily.    Marland Kitchen triamcinolone cream (KENALOG) 0.1 % Apply 1 application topically daily as needed (eczema).     No current facility-administered medications on file prior to visit.    Cardiovascular studies:  Echocardiogram 09/23/2019: Normal LV systolic function with EF 65%. Moderate concentric hypertrophy of the left ventricle. Left ventricle cavity is normal in size. Normal global wall motion. Doppler evidence of grade I (impaired) diastolic  dysfunction, normal LAP. Calculated EF 65%. Left atrial cavity is mildly dilated. Trileaflet aortic valve with mild aortic valve leaflet calcification. No regurgitation noted. Trace aortic stenosis. Aortic valve mean gradient of 7 mmHg, Vmax of 2.0 m/s. Calculated aortic valve area by continuity equation is 2.9 cm. Trace tricuspid regurgitation. Estimated pulmonary artery systolic pressure is 33 mmHg.  EKG 09/17/2019: Sinus rhythm 72 bpm. Short PR interval. Cannot exclude old inferior infarct.  Renal artery duplex 03/26/2018: 1. No Doppler ultrasound evidence of hemodynamically significant renal artery stenosis. If there is continued clinical concern, renal MRA (lower radiation risk, can be performed noncontrast in the setting of renal dysfunction) and CTA ( higher spatial resolution) represent more accurate studies, which are additionally more sensitive to the detection of duplicated renal arteries. 2. 3.7 cm echogenic right hepatic lesion, possibly benign hemangioma but incompletely characterized. Recommend liver protocol MRI or CT with contrast for possibly diagnostic characterization.  Recent labs: 06/14/2019: Glucose 121. BUN/Cr 10/1.07. eGFR 68. Na/K 130/3.7. Rest of the CMP normal.  H/H 12.7/37.2. MCV 82. Platelets 224 Chol 169, TG 188, HDL 66, LDL 65.  HbA1C 7.0%  Review of Systems  Constitution: Negative for decreased appetite, malaise/fatigue, weight gain and weight loss.  HENT: Negative for congestion.   Eyes: Negative for visual disturbance.  Cardiovascular: Positive for leg swelling. Negative for chest pain, dyspnea on exertion, palpitations and syncope.  Respiratory: Negative for cough.   Endocrine: Negative for cold intolerance.  Hematologic/Lymphatic: Does not bruise/bleed easily.  Skin: Negative for itching and rash.  Musculoskeletal: Negative for myalgias.  Gastrointestinal: Negative for abdominal pain, nausea and vomiting.  Genitourinary: Negative for dysuria.   Neurological: Negative for dizziness and weakness.  Psychiatric/Behavioral: The patient is not nervous/anxious.   All other systems reviewed and are negative.        Vitals:   10/24/19 1040  BP: (!) 167/84  Temp: 98.2 F (36.8 C)  SpO2: 95%     Body mass index is 29.37 kg/m. Filed Weights   10/24/19 1040  Weight: 187 lb 8 oz (85 kg)     Objective:   Physical Exam  Constitutional: He is oriented to person, place, and time. He appears well-developed and well-nourished. No distress.  HENT:  Head: Normocephalic and atraumatic.  Eyes: Pupils are equal, round, and reactive to light. Conjunctivae are normal.  Neck: No JVD present.  Cardiovascular: Normal rate, regular rhythm and intact distal pulses.  Murmur heard.  Early systolic murmur is present with a grade of 2/6 at the upper right sternal border. Pulmonary/Chest: Effort normal and breath sounds normal. He has no wheezes. He has no rales.  Abdominal: Soft. Bowel sounds are normal. There is no rebound.  Musculoskeletal:        General: Edema (2+ b/l) present.  Lymphadenopathy:    He has no cervical adenopathy.  Neurological: He is alert and oriented to person, place, and time. No cranial nerve deficit.  Skin: Skin is warm and dry.  Psychiatric: He has a normal mood and affect.  Nursing note and vitals reviewed.         Assessment & Recommendations:   68 y.o. Caucasian male with hypertension, type 2 DM, leg edema  Leg edema: Echocardiogram showed mild aortic stenosis, grade 1 diastolic dysfunction and mild pulmonary hypertension.   Reduce amlodipine from 10 mg to 5 mg daily. Switch metoprolol 50 mg daily carvedilol 12.5 mg twice daily. Increase Lasix to 40 mg twice daily.  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  Recommend sleep apnea evaluation.  Hyponatremia: Suspect dilutional hyponatremia, exacerbated by use of hydrochlorothiazide which is now stopped. BMP today.  Follow-up in 4 weeks.   [name], MD [facility] Pager: [phone] Office: [phone] If no answer Cell [phone]

## 2019-10-25 ENCOUNTER — Telehealth: Payer: Self-pay | Admitting: Cardiology

## 2019-10-25 ENCOUNTER — Telehealth: Payer: Self-pay

## 2019-10-25 LAB — BASIC METABOLIC PANEL
BUN/Creatinine Ratio: 9 — ABNORMAL LOW (ref 10–24)
BUN: 9 mg/dL (ref 8–27)
CO2: 22 mmol/L (ref 20–29)
Calcium: 9.9 mg/dL (ref 8.6–10.2)
Chloride: 98 mmol/L (ref 96–106)
Creatinine, Ser: 1.01 mg/dL (ref 0.76–1.27)
GFR calc Af Amer: 87 mL/min/{1.73_m2} (ref >59)
GFR calc non Af Amer: 75 mL/min/{1.73_m2} (ref >59)
Glucose: 240 mg/dL — ABNORMAL HIGH (ref 65–99)
Potassium: 4 mmol/L (ref 3.5–5.2)
Sodium: 139 mmol/L (ref 134–144)

## 2019-10-25 NOTE — Telephone Encounter (Signed)
Lab results reviewed. Sodium is normal now, at 139. Blood sugar is elevated at 240. Please ask him to follow up with his PCP re: diabetes management.   Thanks  MJP

## 2019-10-25 NOTE — Telephone Encounter (Signed)
Spoke with patient concerning labs. He will follow up with PCP. He verbalized understanding.

## 2019-11-06 ENCOUNTER — Encounter: Payer: Self-pay | Admitting: Neurology

## 2019-11-06 ENCOUNTER — Ambulatory Visit (INDEPENDENT_AMBULATORY_CARE_PROVIDER_SITE_OTHER): Payer: Medicare Other | Admitting: Neurology

## 2019-11-06 ENCOUNTER — Other Ambulatory Visit: Payer: Self-pay

## 2019-11-06 VITALS — BP 173/90 | HR 79 | Temp 97.6°F | Ht 67.0 in | Wt 188.3 lb

## 2019-11-06 DIAGNOSIS — R5383 Other fatigue: Secondary | ICD-10-CM

## 2019-11-06 DIAGNOSIS — R0683 Snoring: Secondary | ICD-10-CM

## 2019-11-06 DIAGNOSIS — R600 Localized edema: Secondary | ICD-10-CM

## 2019-11-06 DIAGNOSIS — R030 Elevated blood-pressure reading, without diagnosis of hypertension: Secondary | ICD-10-CM | POA: Diagnosis not present

## 2019-11-06 DIAGNOSIS — R351 Nocturia: Secondary | ICD-10-CM

## 2019-11-06 DIAGNOSIS — E663 Overweight: Secondary | ICD-10-CM | POA: Diagnosis not present

## 2019-11-06 NOTE — Progress Notes (Signed)
Subjective:    Patient ID: DUGLAS DADO is a 68 y.o. male.  HPI     Star Age, MD, PhD [facility] [address] [place], [place] [address]  Dear Dr. Virgina Jock, I saw your patient, Khalifah Macdougall, upon your kind request in my sleep clinic today for initial consultation of his sleep disorder, in particular, concern for underlying obstructive sleep apnea.  The patient is unaccompanied today.  As you know, Mr. Flocco is a 68 year old right-handed gentleman with an underlying history of reflux disease, diabetes, hypertension, lower extremity edema and overweight state, who reports snoring and excessive daytime somnolence.  I reviewed your office note from 10/24/2019.  His Epworth sleepiness score is 7 out of 24, fatigue severity score is 26 out of 63. He has had lower extremity swelling.  He is now on Lasix.  He has had some electrolyte disturbances including low sodium which is now better.  He is keeping a blood pressure log.  In the past month his blood pressure values have been more elevated, in the borderline range and higher.  About a month ago he was more consistently in the 120s and 130s over 70s and 80s.  I reviewed his blood pressure log.  He reports some daytime tiredness and the need to take a nap once a day typically for an hour.  He has been a napper for the past 2 years or so.He reports a history of low back pain and right-sided sciatica.  It helps to Lay down in the afternoon to rest for his back.He has no family history of OSA as he recalls.  He does have a family history of hypertension and stroke.  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  He lives with his wife, they have 2 grown sons.  He has a dog in the household, The dog tends to sleep on the bed with them.  He drinks caffeine in the form of diet cola, 2 cans/day and coffee about twice a week.  He quit smoking in 1984.  He drinks alcohol daily, 4-5 mixed drinks per day.  He has been told to reduce  his alcohol intake as I understand.  He reports that he plans to reduce his alcohol consumption.Bedtime is around 11, rise time between 7 and 7:30 AM.  He has nocturia about once or twice per average night, denies any recurrent morning headaches or restless leg symptoms.  He had a tonsillectomy at age 56.   Her Past Medical History Is Significant For: Past Medical History:  Diagnosis Date  . Diabetes mellitus   . GERD (gastroesophageal reflux disease)   . Hypertension     His Past Surgical History Is Significant For: Past Surgical History:  Procedure Laterality Date  . COLONOSCOPY WITH PROPOFOL N/A 02/16/2016   Procedure: COLONOSCOPY WITH PROPOFOL;  Surgeon: Garlan Fair, MD;  Location: [facility];  Service: Endoscopy;  Laterality: N/A;  . INGUINAL HERNIA REPAIR     right at 68 years of age  . TONSILLECTOMY      His Family History Is Significant For: Family History  Problem Relation Age of Onset  . Stroke Mother   . Hypertension Mother   . Hypertension Father   . Congestive Heart Failure Father   . Diabetes Maternal Grandmother   . Diabetes Maternal Grandfather   . Diabetes Paternal Grandmother   . Diabetes Paternal Grandfather     His Social History Is Significant For: Social History   Socioeconomic History  .  Marital status: Married    Spouse name: Not on file  . Number of children: 2  . Years of education: Not on file  . Highest education level: Not on file  Occupational History  . Not on file  Tobacco Use  . Smoking status: Former Smoker    Packs/day: 2.00    Years: 10.00    Pack years: 20.00    Types: Cigarettes    Quit date: 1984    Years since quitting: 37.0  . Smokeless tobacco: Never Used  Substance and Sexual Activity  . Alcohol use: Yes    Alcohol/week: 16.0 standard drinks    Types: 16 Standard drinks or equivalent per week    Comment: [id number]  . Drug use: No  . Sexual activity: Not on file  Other Topics Concern  . Not on file  Social  History Narrative  . Not on file   Social Determinants of Health   Financial Resource Strain:   . Difficulty of Paying Living Expenses: Not on file  Food Insecurity:   . Worried About Charity fundraiser in the Last Year: Not on file  . Ran Out of Food in the Last Year: Not on file  Transportation Needs:   . Lack of Transportation (Medical): Not on file  . Lack of Transportation (Non-Medical): Not on file  Physical Activity:   . Days of Exercise per Week: Not on file  . Minutes of Exercise per Session: Not on file  Stress:   . Feeling of Stress : Not on file  Social Connections:   . Frequency of Communication with Friends and Family: Not on file  . Frequency of Social Gatherings with Friends and Family: Not on file  . Attends Religious Services: Not on file  . Active Member of Clubs or Organizations: Not on file  . Attends Archivist Meetings: Not on file  . Marital Status: Not on file    His Allergies Are:  No Known Allergies:   His Current Medications Are:  Outpatient Encounter Medications as of 11/06/2019  Medication Sig  . acetaminophen (TYLENOL) 500 MG tablet Take 500 mg by mouth every 6 (six) hours as needed for moderate pain.  Marland Kitchen amLODipine (NORVASC) 10 MG tablet Take 5 mg by mouth every morning.  Marland Kitchen aspirin EC 81 MG tablet Take 81 mg by mouth daily.  . carvedilol (COREG) 12.5 MG tablet Take 1 tablet (12.5 mg total) by mouth 2 (two) times daily.  . fluticasone (FLONASE) 50 MCG/ACT nasal spray Place 2 sprays into both nostrils every morning.  . furosemide (LASIX) 20 MG tablet Take 2 tablets (40 mg total) by mouth 2 (two) times daily. 2 in morning, 1 in early afternoon  . hydrALAZINE (APRESOLINE) 25 MG tablet Take 25 mg by mouth 3 (three) times daily.   . metFORMIN (GLUCOPHAGE) 500 MG tablet Take 500 mg by mouth 2 (two) times daily with a meal. 2 in am , 2 in evening  . olmesartan (BENICAR) 40 MG tablet Take 1 tablet (40 mg total) by mouth daily.  Marland Kitchen omeprazole  (PRILOSEC OTC) 20 MG tablet Take 20 mg by mouth daily.   . potassium chloride (KLOR-CON) 10 MEQ tablet Take 10 mEq by mouth 2 (two) times daily.  . simvastatin (ZOCOR) 40 MG tablet Take 20 mg by mouth every morning.  . triamcinolone cream (KENALOG) 0.1 % Apply 1 application topically daily as needed (eczema).  . [DISCONTINUED] tamsulosin (FLOMAX) 0.4 MG CAPS capsule Take 0.4 mg by  mouth daily.   No facility-administered encounter medications on file as of 11/06/2019.  :  Review of Systems:  Out of a complete 14 point review of systems, all are reviewed and negative with the exception of these symptoms as listed below:  Review of Systems  Neurological:       Referred by [facility] for sleep consult- pt sts no prior sleep study; reports he does snore.  Epworth Sleepiness Scale 0= would never doze 1= slight chance of dozing 2= moderate chance of dozing 3= high chance of dozing  Sitting and reading:1 Watching TV:1 Sitting inactive in a public place (ex. Theater or meeting):0 As a passenger in a car for an hour without a break:1 Lying down to rest in the afternoon:3 Sitting and talking to someone:0 Sitting quietly after lunch (no alcohol):1 In a car, while stopped in traffic:0 Total:7     Objective:  Neurological Exam  Physical Exam Physical Examination:   Vitals:   11/06/19 0839  BP: (!) 173/90  Pulse: 79  Temp: 97.6 F (36.4 C)    General Examination: The patient is a very pleasant 68 y.o. male in no acute distress. He appears well-developed and well-nourished and well groomed.   HEENT: Normocephalic, atraumatic, pupils are equal, round and reactive to light, extraocular tracking is good without limitation to gaze excursion or nystagmus noted. Hearing is grossly intact. Face is symmetric with normal facial animation. Speech is clear with no dysarthria noted. There is no hypophonia. There is no lip, neck/head, jaw or voice tremor. Neck is supple with full range of  passive and active motion. There are no carotid bruits on auscultation. Oropharynx exam reveals: mild mouth dryness, adequate dental hygiene and moderate airway crowding, due to Redundant soft palate, Mallampati class III, wider uvula.  Tonsils are absent.  Partial plate in place on the top, tongue protrudes centrally in palate elevates symmetrically, neck circumference is 17-5/8 inches.  He has a minimal overbite  Chest: Clear to auscultation without wheezing, rhonchi or crackles noted.  Heart: S1+S2+0, regular and normal without murmurs, rubs or gallops noted.   Abdomen: Soft, non-tender and non-distended with normal bowel sounds appreciated on auscultation.  Extremities: There is 1+ pitting edema in the distal lower extremities bilaterally.   Skin: Warm and dry without trophic changes noted.   Musculoskeletal: exam reveals no obvious joint deformities, tenderness or joint swelling or erythema.   Neurologically:  Mental status: The patient is awake, alert and oriented in all 4 spheres. His immediate and remote memory, attention, language skills and fund of knowledge are appropriate. There is no evidence of aphasia, agnosia, apraxia or anomia. Speech is clear with normal prosody and enunciation. Thought process is linear. Mood is normal and affect is normal.  Cranial nerves II - XII are as described above under HEENT exam.  Motor exam: Normal bulk, strength and tone is noted. There is no tremor, Romberg is negative. Reflexes are 2+ throughout. Fine motor skills and coordination: grossly intact.  Cerebellar testing: No dysmetria or intention tremor. There is no truncal or gait ataxia.  Sensory exam: intact to light touch in the upper and lower extremities.  Gait, station and balance: He stands easily. No veering to one side is noted. No leaning to one side is noted. Posture is age-appropriate and stance is narrow based. Gait shows normal stride length and normal pace. No problems turning are  noted. Tandem walk is difficult for him, some LB discomfort.  Assessment and Plan:  In summary,  RENEL KLINGE is a very pleasant 68 y.o.-year old male with an underlying history of reflux disease, diabetes, hypertension, lower extremity edema and overweight state, whose history and physical exam are concerning for obstructive sleep apnea (OSA). I had a long chat with the patient about my findings and the diagnosis of OSA, its prognosis and treatment options. We talked about medical treatments, surgical interventions and non-pharmacological approaches. I explained in particular the risks and ramifications of untreated moderate to severe OSA, especially with respect to developing cardiovascular disease down the Road, including congestive heart failure, difficult to treat hypertension, cardiac arrhythmias, or stroke. Even type 2 diabetes has, in part, been linked to untreated OSA. Symptoms of untreated OSA include daytime sleepiness, memory problems, mood irritability and mood disorder such as depression and anxiety, lack of energy, as well as recurrent headaches, especially morning headaches. We talked about trying to maintain a healthy lifestyle in general, as well as the importance of weight control. He is encouraged to scale back on his daily alcohol consumption and limit himself to up to 1-2 drinks per day if possible. We also talked about the importance of good sleep hygiene. I recommended the following at this time: sleep study.  I explained the sleep test procedure to the patient and also outlined possible surgical and non-surgical treatment options of OSA, including the use of a custom-made dental device (which would require a referral to a specialist dentist or oral surgeon), upper airway surgical options, such as traditional UPPP or a novel less invasive surgical option in the form of Inspire hypoglossal nerve stimulation (which would involve a referral to an ENT surgeon). I also explained the CPAP  treatment option to the patient, who indicated that he would be willing to try CPAP if the need arises. I explained the importance of being compliant with PAP treatment, not only for insurance purposes but primarily to improve His symptoms, and for the patient's long term health benefit, including to reduce His cardiovascular risks. I answered all his questions today and the patient was in agreement. I plan to see him back after the sleep study is completed and encouraged him to call with any interim questions, concerns, problems or updates.   Thank you very much for allowing me to participate in the care of this nice patient. If I can be of any further assistance to you please do not hesitate to call me at [phone].  Sincerely,   Star Age, MD, PhD

## 2019-11-06 NOTE — Patient Instructions (Signed)

## 2019-11-11 ENCOUNTER — Other Ambulatory Visit: Payer: Self-pay

## 2019-11-11 DIAGNOSIS — R600 Localized edema: Secondary | ICD-10-CM

## 2019-11-11 DIAGNOSIS — I10 Essential (primary) hypertension: Secondary | ICD-10-CM

## 2019-11-11 MED ORDER — FUROSEMIDE 40 MG PO TABS: 40.0000 mg | ORAL_TABLET | Freq: Two times a day (BID) | ORAL | 3 refills | Status: DC

## 2019-11-11 NOTE — Telephone Encounter (Signed)
Telephone encounter:  Reason for call: Hypertension, reading last 3 days have been 161/95, 180/90, 152/84, 166/95. HR has been 60-70's. Feeling ok, no other symptoms just concerned about elevated bp. Is awaiting call to schedule sleep study.//ah  Usual provider: [facility]  Last office visit: 10/24/19  Next office visit: 11/29/19   Last hospitalization: 02/16/16   Current Outpatient Medications on File Prior to Visit  Medication Sig Dispense Refill  . acetaminophen (TYLENOL) 500 MG tablet Take 500 mg by mouth every 6 (six) hours as needed for moderate pain.    Marland Kitchen amLODipine (NORVASC) 10 MG tablet Take 5 mg by mouth every morning.    Marland Kitchen aspirin EC 81 MG tablet Take 81 mg by mouth daily.    . carvedilol (COREG) 12.5 MG tablet Take 1 tablet (12.5 mg total) by mouth 2 (two) times daily. 60 tablet 3  . fluticasone (FLONASE) 50 MCG/ACT nasal spray Place 2 sprays into both nostrils every morning.    . furosemide (LASIX) 20 MG tablet Take 2 tablets (40 mg total) by mouth 2 (two) times daily. 2 in morning, 1 in early afternoon 120 tablet 2  . hydrALAZINE (APRESOLINE) 25 MG tablet Take 25 mg by mouth 3 (three) times daily.     . metFORMIN (GLUCOPHAGE) 500 MG tablet Take 500 mg by mouth 2 (two) times daily with a meal. 2 in am , 2 in evening    . olmesartan (BENICAR) 40 MG tablet Take 1 tablet (40 mg total) by mouth daily. 30 tablet 3  . omeprazole (PRILOSEC OTC) 20 MG tablet Take 20 mg by mouth daily.     . potassium chloride (KLOR-CON) 10 MEQ tablet Take 10 mEq by mouth 2 (two) times daily.    . simvastatin (ZOCOR) 40 MG tablet Take 20 mg by mouth every morning.    . triamcinolone cream (KENALOG) 0.1 % Apply 1 application topically daily as needed (eczema).     No current facility-administered medications on file prior to visit.

## 2019-11-14 ENCOUNTER — Other Ambulatory Visit: Payer: Self-pay

## 2019-11-14 DIAGNOSIS — I10 Essential (primary) hypertension: Secondary | ICD-10-CM

## 2019-11-14 MED ORDER — CARVEDILOL 12.5 MG PO TABS: 12.5000 mg | ORAL_TABLET | Freq: Three times a day (TID) | ORAL | 3 refills | Status: DC

## 2019-11-28 ENCOUNTER — Ambulatory Visit: Payer: Medicare Other | Attending: Internal Medicine

## 2019-11-28 DIAGNOSIS — Z23 Encounter for immunization: Secondary | ICD-10-CM | POA: Insufficient documentation

## 2019-11-28 NOTE — Progress Notes (Signed)
   Covid-19 Vaccination Clinic  Name:  Joseph Steele    MRN: [id number] DOB: [birth year]  11/28/2019  Mr. Joseph Steele was observed post Covid-19 immunization for 15 minutes without incidence. He was provided with Vaccine Information Sheet and instruction to access the V-Safe system.   Mr. Joseph Steele was instructed to call 911 with any severe reactions post vaccine: Marland Kitchen Difficulty breathing  . Swelling of your face and throat  . A fast heartbeat  . A bad rash all over your body  . Dizziness and weakness    Immunizations Administered    Name Date Dose VIS Date Route   Pfizer COVID-19 Vaccine 11/28/2019 10:07 AM 0.3 mL 10/25/2019 Intramuscular   Manufacturer: [employer]   Lot: [id number]   [place]: [id number]

## 2019-11-29 ENCOUNTER — Ambulatory Visit: Payer: Medicare Other | Admitting: Cardiology

## 2019-12-02 ENCOUNTER — Ambulatory Visit (INDEPENDENT_AMBULATORY_CARE_PROVIDER_SITE_OTHER): Payer: Medicare Other | Admitting: Neurology

## 2019-12-02 DIAGNOSIS — R0683 Snoring: Secondary | ICD-10-CM

## 2019-12-02 DIAGNOSIS — R5383 Other fatigue: Secondary | ICD-10-CM

## 2019-12-02 DIAGNOSIS — R351 Nocturia: Secondary | ICD-10-CM

## 2019-12-02 DIAGNOSIS — R030 Elevated blood-pressure reading, without diagnosis of hypertension: Secondary | ICD-10-CM

## 2019-12-02 DIAGNOSIS — R600 Localized edema: Secondary | ICD-10-CM

## 2019-12-02 DIAGNOSIS — E663 Overweight: Secondary | ICD-10-CM

## 2019-12-02 DIAGNOSIS — G4733 Obstructive sleep apnea (adult) (pediatric): Secondary | ICD-10-CM | POA: Diagnosis not present

## 2019-12-02 DIAGNOSIS — G4720 Circadian rhythm sleep disorder, unspecified type: Secondary | ICD-10-CM

## 2019-12-03 ENCOUNTER — Telehealth: Payer: Self-pay | Admitting: Cardiology

## 2019-12-03 NOTE — Telephone Encounter (Signed)
Either is fine with me.  Thanks MJP

## 2019-12-05 ENCOUNTER — Ambulatory Visit: Payer: Medicare Other | Admitting: Cardiology

## 2019-12-12 ENCOUNTER — Telehealth: Payer: Self-pay

## 2019-12-12 NOTE — Telephone Encounter (Signed)
I contacted the pt and we were able to review sleep study result.  Pt verbalized understanding. He sts he is scheduled the see his cardiologist Dr. Virgina Jock on 12/20/2019 and would like to wait to schedule the cpap titration until after this appointment.  Pt was advised to update sleep clinic on this once they call if they were to call him before 12/20/2019.

## 2019-12-12 NOTE — Telephone Encounter (Signed)
-----   Message from Star Age, MD sent at 12/12/2019  8:19 AM EST ----- Patient referred by Dr. Virgina Jock, seen by me on 11/06/19, diagnostic PSG on 12/02/19.   Please call and notify the patient that the recent sleep study showed mild to moderate obstructive sleep apnea, his oxygen dropped to as low as 72%, therefore, I recommend treatment in the form of CPAP. This will require a repeat sleep study for proper titration and mask fitting and correct monitoring of the oxygen saturations. Please explain to patient. I have placed an order in the chart. Thanks.  Star Age, MD, PhD [facility])

## 2019-12-12 NOTE — Progress Notes (Signed)
Patient referred by Dr. Virgina Jock, seen by me on 11/06/19, diagnostic PSG on 12/02/19.   Please call and notify the patient that the recent sleep study showed mild to moderate obstructive sleep apnea, his oxygen dropped to as low as 72%, therefore, I recommend treatment in the form of CPAP. This will require a repeat sleep study for proper titration and mask fitting and correct monitoring of the oxygen saturations. Please explain to patient. I have placed an order in the chart. Thanks.  Star Age, MD, PhD [facility])

## 2019-12-12 NOTE — Addendum Note (Signed)
Addended by: Star Age on: 12/12/2019 08:19 AM   Modules accepted: Orders

## 2019-12-12 NOTE — Procedures (Signed)
PATIENT'S NAME:  Joseph Steele, Joseph Steele DOB:      [birth year]      MR#:    [id number]     DATE OF RECORDING: 12/02/2019 REFERRING M.D.:  Dr. Jerilynn Mages. [facility]  Study Performed:   Baseline Polysomnogram HISTORY: 69 year old man with a history of reflux disease, diabetes, hypertension, lower extremity edema and overweight state, who reports snoring and excessive daytime somnolence. The patient endorsed the Epworth Sleepiness Scale at 7 points. The patient's weight 187 pounds with a height of 67 (inches), resulting in a BMI of 29.4 kg/m2. The patient's neck circumference measured 17.5 inches.  CURRENT MEDICATIONS: Tylenol, Norvasc, Coreg, Aspirin, Flonase, Lasix, Apresoline, Metformin, Benicar, Prilosec, Klor-con., Zocor, Kenalog.   PROCEDURE:  This is a multichannel digital polysomnogram utilizing the Somnostar 11.2 system.  Electrodes and sensors were applied and monitored per AASM Specifications.   EEG, EOG, Chin and Limb EMG, were sampled at 200 Hz.  ECG, Snore and Nasal Pressure, Thermal Airflow, Respiratory Effort, CPAP Flow and Pressure, Oximetry was sampled at 50 Hz. Digital video and audio were recorded.      BASELINE STUDY  Lights Out was at 22:39 and Lights On at 04:59.  Total recording time (TRT) was 380.5 minutes, with a total sleep time (TST) of 273 minutes.   The patient's sleep latency was 44 minutes, which is delayed. REM latency was 60.5 minutes, which is mildly reduced. The sleep efficiency was 71.7 %.     SLEEP ARCHITECTURE: WASO (Wake after sleep onset) was 73 minutes with mild to moderate sleep fragmentation noted.  There were 30.5 minutes in Stage N1, 54.5 minutes Stage N2, 103 minutes Stage N3 and 85 minutes in Stage REM.  The percentage of Stage N1 was 11.2%, which is increased, Stage N2 was 20.%, Stage N3 was 37.7%, which is markedly increased, and Stage R (REM sleep) was 31.1%, which is increased. The arousals were noted as: 52 were spontaneous, 0 were associated with PLMs, 16 were associated  with respiratory events.  RESPIRATORY ANALYSIS:  There were a total of 39 respiratory events:  5 obstructive apneas, 1 central apneas and 2 mixed apneas with a total of 8 apneas and an apnea index (AI) of 1.8 /hour. There were 31 hypopneas with a hypopnea index of 6.8 /hour. The patient also had 0 respiratory event related arousals (RERAs).      The total APNEA/HYPOPNEA INDEX (AHI) was 8.6/hour and the total RESPIRATORY DISTURBANCE INDEX was  8.6 /hour.  33 events occurred in REM sleep and 9 events in NREM. The REM AHI was  23.3 /hour, versus a non-REM AHI of 1.9. The patient spent 3.5 minutes of total sleep time in the supine position and 270 minutes in non-supine.. The supine AHI was 68.6 versus a non-supine AHI of 7.8.  OXYGEN SATURATION & C02:  The Wake baseline 02 saturation was 96%, with the lowest being 72%. Time spent below 89% saturation equaled 35 minutes.  PERIODIC LIMB MOVEMENTS:   The patient had a total of 0 Periodic Limb Movements.  The Periodic Limb Movement (PLM) index was 0 and the PLM Arousal index was 0/hour.  Audio and video analysis did not show any abnormal or unusual movements, behaviors, phonations or vocalizations. The patient took 1 bathroom break. Moderate snoring was noted. The EKG was in keeping with normal sinus rhythm (NSR).  Post-study, the patient indicated that sleep was the same as usual.   IMPRESSION:  1. Obstructive Sleep Apnea (OSA) 2. Dysfunctions associated with sleep stages or arousal from  sleep  RECOMMENDATIONS:  1. This study demonstrates overall mild obstructive sleep apnea, moderate in REM sleep with a total AHI of 8.6/hour, REM AHI of 23.3/hour, and O2 nadir of 72%. The minimal amount of supine sleep may underestimate his AHI and O2 nadir. Given the patient's medical history and sleep related complaints, treatment with positive airway pressure is recommended. This will require, ideally, a full night titration study to optimize therapy and monitor  his O2 saturations. Other treatment options may include avoidance of supine sleep position along with weight loss, upper airway or jaw surgery in selected patients or the use of an oral appliance in certain patients. ENT evaluation and/or consultation with a maxillofacial surgeon or dentist may be feasible in some instances.    2. Please note that untreated obstructive sleep apnea may carry additional perioperative morbidity. Patients with significant obstructive sleep apnea should receive perioperative PAP therapy and the surgeons and particularly the anesthesiologist should be informed of the diagnosis and the severity of the sleep disordered breathing. 3. This study shows sleep fragmentation and abnormal sleep stage percentages; these are nonspecific findings and per se do not signify an intrinsic sleep disorder or a cause for the patient's sleep-related symptoms. Causes include (but are not limited to) the first night effect of the sleep study, circadian rhythm disturbances, medication effect or an underlying mood disorder or medical problem.  4. The patient should be cautioned not to drive, work at heights, or operate dangerous or heavy equipment when tired or sleepy. Review and reiteration of good sleep hygiene measures should be pursued with any patient. 5. The patient will be seen in follow-up by Dr. Rexene Alberts at [facility] for discussion of the test results and further management strategies. The referring provider will be notified of the test results.  I certify that I have reviewed the entire raw data recording prior to the issuance of this report in accordance with the Standards of Accreditation of the American Academy of Sleep Medicine (AASM)   Star Age, MD, PhD Diplomat, American Board of Neurology and Sleep Medicine (Neurology and Sleep Medicine)

## 2019-12-16 ENCOUNTER — Ambulatory Visit: Payer: Medicare Other

## 2019-12-18 ENCOUNTER — Ambulatory Visit: Payer: Medicare Other | Attending: Internal Medicine

## 2019-12-18 DIAGNOSIS — Z23 Encounter for immunization: Secondary | ICD-10-CM | POA: Insufficient documentation

## 2019-12-18 NOTE — Progress Notes (Signed)
   Covid-19 Vaccination Clinic  Name:  AUTHOR SLAVEN    MRN: [id number] DOB: 1950/11/19  12/18/2019  Mr. Joseph Steele was observed post Covid-19 immunization for 15 minutes without incidence. He was provided with Vaccine Information Sheet and instruction to access the V-Safe system.   Mr. Joseph Steele was instructed to call 911 with any severe reactions post vaccine: Marland Kitchen Difficulty breathing  . Swelling of your face and throat  . A fast heartbeat  . A bad rash all over your body  . Dizziness and weakness    Immunizations Administered    Name Date Dose VIS Date Route   Pfizer COVID-19 Vaccine 12/18/2019  9:54 AM 0.3 mL 10/25/2019 Intramuscular   Manufacturer: [place]   Lot: [id number]   [place]: [id number]

## 2019-12-20 ENCOUNTER — Other Ambulatory Visit: Payer: Self-pay

## 2019-12-20 ENCOUNTER — Encounter: Payer: Self-pay | Admitting: Cardiology

## 2019-12-20 ENCOUNTER — Ambulatory Visit (INDEPENDENT_AMBULATORY_CARE_PROVIDER_SITE_OTHER): Payer: Medicare Other | Admitting: Cardiology

## 2019-12-20 VITALS — BP 121/70 | HR 76 | Temp 98.2°F | Ht 66.5 in | Wt 182.4 lb

## 2019-12-20 DIAGNOSIS — I10 Essential (primary) hypertension: Secondary | ICD-10-CM

## 2019-12-20 DIAGNOSIS — E871 Hypo-osmolality and hyponatremia: Secondary | ICD-10-CM | POA: Insufficient documentation

## 2019-12-20 DIAGNOSIS — R600 Localized edema: Secondary | ICD-10-CM | POA: Insufficient documentation

## 2019-12-20 DIAGNOSIS — I350 Nonrheumatic aortic (valve) stenosis: Secondary | ICD-10-CM | POA: Diagnosis not present

## 2019-12-20 MED ORDER — POTASSIUM CHLORIDE CRYS ER 10 MEQ PO TBCR: 10.0000 meq | EXTENDED_RELEASE_TABLET | ORAL | 3 refills | Status: DC

## 2019-12-20 MED ORDER — FUROSEMIDE 40 MG PO TABS: 40.0000 mg | ORAL_TABLET | ORAL | 3 refills | Status: DC

## 2019-12-20 NOTE — Progress Notes (Signed)
Patient referred by [name],* for leg edema  Subjective:   Joseph Steele, male    DOB: 1951/10/07, 69 y.o.   MRN: [id number]   Chief Complaint  Patient presents with  . Hypertension  . Edema  . Hyponatremia  . Follow-up    4 weeks    HPI  69 y.o. Caucasian male with hypertension, type 2 DM, leg edema.  At last visit, suspecting amlodipine contributing to his leg edema, I reduced his amlodipine from 10 mg to 5 mg daily.  I switch metoprolol 50 mg daily to carvedilol 12.5 mg twice daily for better blood pressure control, and increased Lasix to 40 mg twice daily.  His leg swelling has completely resolved. Recent labs discussed with the patient.  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  Since July 2020, he has noticed worsening bilateral leg swelling.  He denies any shortness of breath, orthopnea, PND, chest pain symptoms.  Also denies any presyncope or syncope.  He was recently started on Lasix 20 mg once daily.  He has not noticed any significant change in his symptoms.  Tel encounter 09/27/19: Discussed echocardiogram, serum and urine osmolality results with the patient and his wife.  Mild HFpEF with mild pulmonary hypertension and mild aortic stenosis.  Dilutional hyponatremia, as evident on serum and urine osmolality. Recommendeded the following. Stop olmesartan-HCTZ 40-25 mg daily.  Start olmesartan 40 mg daily alone.  Reduce Lasix to 20 mg 1 in the morning, 1 in the afternoon.  If blood pressure stays greater than 140/80 mmHg, as long as heart rate is greater than 60, patient can take metoprolol tartrate 50 mg twice daily.  Repeat BMP in first week of December.  Follow-up in the office on 10/24/2019.    Current Outpatient Medications on File Prior to Visit  Medication Sig Dispense Refill  . acetaminophen (TYLENOL) 500 MG tablet Take 500 mg by mouth every 6 (six) hours as  needed for moderate pain.    Marland Kitchen amLODipine (NORVASC) 10 MG tablet Take 5 mg by mouth every morning.    Marland Kitchen aspirin EC 81 MG tablet Take 81 mg by mouth daily.    . fluticasone (FLONASE) 50 MCG/ACT nasal spray Place 2 sprays into both nostrils every morning.    . furosemide (LASIX) 40 MG tablet Take 1 tablet (40 mg total) by mouth 2 (two) times daily. 60 tablet 3  . hydrALAZINE (APRESOLINE) 25 MG tablet Take 25 mg by mouth 3 (three) times daily.     . metFORMIN (GLUCOPHAGE) 500 MG tablet Take 500 mg by mouth 2 (two) times daily with a meal. 2 in am , 2 in evening    . olmesartan (BENICAR) 40 MG tablet Take 1 tablet (40 mg total) by mouth daily. 30 tablet 3  . omeprazole (PRILOSEC OTC) 20 MG tablet Take 20 mg by mouth daily.     . potassium chloride (KLOR-CON) 10 MEQ tablet Take 10 mEq by mouth 2 (two) times daily.    . simvastatin (ZOCOR) 40 MG tablet Take 20 mg by mouth every morning.    . triamcinolone cream (KENALOG) 0.1 % Apply 1 application topically daily as needed (eczema).     No current facility-administered medications on file prior to visit.    Cardiovascular studies:  Echocardiogram 09/23/2019: Normal LV systolic function with EF 65%. Moderate concentric hypertrophy of the left ventricle. Left ventricle cavity is normal in size. Normal global  wall motion. Doppler evidence of grade I (impaired) diastolic dysfunction, normal LAP. Calculated EF 65%. Left atrial cavity is mildly dilated. Trileaflet aortic valve with mild aortic valve leaflet calcification. No regurgitation noted. Trace aortic stenosis. Aortic valve mean gradient of 7 mmHg, Vmax of 2.0 m/s. Calculated aortic valve area by continuity equation is 2.9 cm. Trace tricuspid regurgitation. Estimated pulmonary artery systolic pressure is 33 mmHg.  EKG 09/17/2019: Sinus rhythm 72 bpm. Short PR interval. Cannot exclude old inferior infarct.   Renal artery duplex 03/26/2018: 1. No Doppler ultrasound evidence of hemodynamically  significant renal artery stenosis. If there is continued clinical concern, renal MRA (lower radiation risk, can be performed noncontrast in the setting of renal dysfunction) and CTA ( higher spatial resolution) represent more accurate studies, which are additionally more sensitive to the detection of duplicated renal arteries. 2. 3.7 cm echogenic right hepatic lesion, possibly benign hemangioma but incompletely characterized. Recommend liver protocol MRI or CT with contrast for possibly diagnostic characterization.  Recent labs: 10/24/2019: Glucose 240. BUN/Cr 9/1.01. eGFR 75. Na/K 139/4.0. Rest of the CMP normal.   06/14/2019: Glucose 121. BUN/Cr 10/1.07. eGFR 68. Na/K 130/3.7. Rest of the CMP normal.  H/H 12.7/37.2. MCV 82. Platelets 224 Chol 169, TG 188, HDL 66, LDL 65.  HbA1C 7.0%  Review of Systems  Constitution: Negative for decreased appetite, malaise/fatigue, weight gain and weight loss.  HENT: Negative for congestion.   Eyes: Negative for visual disturbance.  Cardiovascular: Positive for leg swelling. Negative for chest pain, dyspnea on exertion, palpitations and syncope.  Respiratory: Negative for cough.   Endocrine: Negative for cold intolerance.  Hematologic/Lymphatic: Does not bruise/bleed easily.  Skin: Negative for itching and rash.  Musculoskeletal: Negative for myalgias.  Gastrointestinal: Negative for abdominal pain, nausea and vomiting.  Genitourinary: Negative for dysuria.  Neurological: Negative for dizziness and weakness.  Psychiatric/Behavioral: The patient is not nervous/anxious.   All other systems reviewed and are negative.        Vitals:   12/20/19 1155  BP: 121/70  Pulse: 76  Temp: 98.2 F (36.8 C)  SpO2: 94%    Body mass index is 29 kg/m. Filed Weights   12/20/19 1155  Weight: 182 lb 6.4 oz (82.7 kg)     Objective:   Physical Exam  Neck: No JVD present.  Cardiovascular: Normal rate, regular rhythm and intact distal pulses.    Murmur heard.  Early systolic murmur is present with a grade of 2/6 at the upper right sternal border. Pulmonary/Chest: Effort normal and breath sounds normal. He has no wheezes. He has no rales.  Musculoskeletal:        General: No edema.  Neurological: No cranial nerve deficit.  Nursing note and vitals reviewed.         Assessment & Recommendations:   69 y.o. Caucasian male with hypertension, type 2 DM, leg edema  Leg edema: Completely resolved.  Can take lasix 40 mg daily, and additional dose only if needed, for leg edema.   Mild aortic stenosis: Asymptomatic. Repeat echocardiogram in 2 years.  Echocardiogram showed mild aortic stenosis, grade 1 diastolic dysfunction and mild pulmonary hypertension.    Hypertension: Well controlled on amlodipine 5 mg daily, carvedilol 12.5 mg twice daily.  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  Recommend sleep apnea evaluation.  Hyponatremia: Resolved after stopping hydrochlorothiazide.   Follow-up in 3 months after repeat BMP.  [name], MD [facility] [place] Pager: [phone] Office: [phone] If no answer Cell [phone]

## 2019-12-23 ENCOUNTER — Other Ambulatory Visit (HOSPITAL_COMMUNITY): Payer: Self-pay | Admitting: Cardiology

## 2019-12-24 LAB — BASIC METABOLIC PANEL
BUN/Creatinine Ratio: 10 (ref 10–24)
BUN: 11 mg/dL (ref 8–27)
CO2: 23 mmol/L (ref 20–29)
Calcium: 9.8 mg/dL (ref 8.6–10.2)
Chloride: 94 mmol/L — ABNORMAL LOW (ref 96–106)
Creatinine, Ser: 1.05 mg/dL (ref 0.76–1.27)
GFR calc Af Amer: 83 mL/min/{1.73_m2} (ref >59)
GFR calc non Af Amer: 72 mL/min/{1.73_m2} (ref >59)
Glucose: 167 mg/dL — ABNORMAL HIGH (ref 65–99)
Potassium: 4.5 mmol/L (ref 3.5–5.2)
Sodium: 136 mmol/L (ref 134–144)

## 2019-12-26 NOTE — Progress Notes (Signed)
Called and spoke with patient regarding His most recent lab results.

## 2020-02-28 ENCOUNTER — Other Ambulatory Visit: Payer: Self-pay

## 2020-02-28 ENCOUNTER — Telehealth: Payer: Self-pay

## 2020-02-28 NOTE — Telephone Encounter (Signed)
done

## 2020-03-20 ENCOUNTER — Ambulatory Visit: Payer: Medicare Other | Admitting: Cardiology

## 2020-03-20 ENCOUNTER — Other Ambulatory Visit: Payer: Self-pay | Admitting: Cardiology

## 2020-03-20 DIAGNOSIS — I10 Essential (primary) hypertension: Secondary | ICD-10-CM

## 2020-04-10 ENCOUNTER — Ambulatory Visit: Payer: Medicare Other | Admitting: Cardiology

## 2020-04-10 ENCOUNTER — Encounter: Payer: Self-pay | Admitting: Cardiology

## 2020-04-10 ENCOUNTER — Other Ambulatory Visit: Payer: Self-pay

## 2020-04-10 VITALS — BP 150/71 | HR 56 | Resp 17 | Ht 66.0 in | Wt 190.0 lb

## 2020-04-10 DIAGNOSIS — I10 Essential (primary) hypertension: Secondary | ICD-10-CM

## 2020-04-10 DIAGNOSIS — R600 Localized edema: Secondary | ICD-10-CM

## 2020-04-10 DIAGNOSIS — I350 Nonrheumatic aortic (valve) stenosis: Secondary | ICD-10-CM

## 2020-04-10 DIAGNOSIS — E871 Hypo-osmolality and hyponatremia: Secondary | ICD-10-CM

## 2020-04-10 MED ORDER — FUROSEMIDE 20 MG PO TABS: 20.0000 mg | ORAL_TABLET | ORAL | 2 refills | Status: DC

## 2020-04-10 MED ORDER — CARVEDILOL 25 MG PO TABS: 25.0000 mg | ORAL_TABLET | Freq: Two times a day (BID) | ORAL | 3 refills | Status: DC

## 2020-04-10 MED ORDER — POTASSIUM CHLORIDE CRYS ER 10 MEQ PO TBCR: 10.0000 meq | EXTENDED_RELEASE_TABLET | Freq: Every day | ORAL | 3 refills | Status: DC

## 2020-04-10 MED ORDER — OLMESARTAN MEDOXOMIL 40 MG PO TABS: 40.0000 mg | ORAL_TABLET | Freq: Every day | ORAL | 3 refills | Status: DC

## 2020-04-10 NOTE — Progress Notes (Signed)
Patient referred by [name],* for leg edema  Subjective:   Joseph Steele, male    DOB: 02-05-1951, 69 y.o.   MRN: [id number]   Chief Complaint  Patient presents with  . Leg Swelling  . Hypertension  . Follow-up    3 MONTH    HPI  69 y.o. Caucasian male with hypertension, type 2 DM, leg edema, h/o HCTZ induced hyponatremia.  Patient is doping very well without any complaints. Blood pressure slightly high today, but very well controlled at home.   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  Since July 2020, he has noticed worsening bilateral leg swelling.  He denies any shortness of breath, orthopnea, PND, chest pain symptoms.  Also denies any presyncope or syncope.  He was recently started on Lasix 20 mg once daily.  He has not noticed any significant change in his symptoms.  Tel encounter 09/27/19: Discussed echocardiogram, serum and urine osmolality results with the patient and his wife.  Mild HFpEF with mild pulmonary hypertension and mild aortic stenosis.  Dilutional hyponatremia, as evident on serum and urine osmolality. Recommendeded the following. Stop olmesartan-HCTZ 40-25 mg daily.  Start olmesartan 40 mg daily alone.  Reduce Lasix to 20 mg 1 in the morning, 1 in the afternoon.  If blood pressure stays greater than 140/80 mmHg, as long as heart rate is greater than 60, patient can take metoprolol tartrate 50 mg twice daily.  Repeat BMP in first week of December.  Follow-up in the office on 10/24/2019.    Current Outpatient Medications on File Prior to Visit  Medication Sig Dispense Refill  . acetaminophen (TYLENOL) 500 MG tablet Take 500 mg by mouth every 6 (six) hours as needed for moderate pain.    Marland Kitchen amLODipine (NORVASC) 10 MG tablet Take 5 mg by mouth every morning.    Marland Kitchen aspirin EC 81 MG tablet Take 81 mg by mouth daily.    . carvedilol (COREG) 12.5 MG tablet TAKE 1  TABLET BY MOUTH THREE TIMES DAILY 90 tablet 0  . famotidine (PEPCID) 20 MG tablet Take 20 mg by mouth daily.    . fluticasone (FLONASE) 50 MCG/ACT nasal spray Place 2 sprays into both nostrils every morning.    . furosemide (LASIX) 20 MG tablet Take 20 mg by mouth daily as needed.    . furosemide (LASIX) 40 MG tablet Take 1 tablet (40 mg total) by mouth as directed. 1 tablet daily. Additional tablet only if more than usual leg swelling noted. 90 tablet 3  . hydrALAZINE (APRESOLINE) 25 MG tablet Take 25 mg by mouth 3 (three) times daily.     . metFORMIN (GLUCOPHAGE) 500 MG tablet Take 1,000 mg by mouth 2 (two) times daily with a meal. 2 in am , 2 in evening    . olmesartan (BENICAR) 40 MG tablet Take 1 tablet by mouth once daily 30 tablet 0  . potassium chloride (KLOR-CON) 10 MEQ tablet Take 1 tablet (10 mEq total) by mouth as directed. 1 tablet daily. Additional tablet only if you take additional dose of lasix. 90 tablet 3  . simvastatin (ZOCOR) 40 MG tablet Take 20 mg by mouth every morning.    . tamsulosin (FLOMAX) 0.4 MG CAPS capsule Take 0.4 mg by mouth daily.    Marland Kitchen triamcinolone cream (KENALOG) 0.1 % Apply 1 application topically daily as needed (eczema).     No current facility-administered medications on  file prior to visit.    Cardiovascular studies:  Echocardiogram 09/23/2019: Normal LV systolic function with EF 65%. Moderate concentric hypertrophy of the left ventricle. Left ventricle cavity is normal in size. Normal global wall motion. Doppler evidence of grade I (impaired) diastolic dysfunction, normal LAP. Calculated EF 65%. Left atrial cavity is mildly dilated. Trileaflet aortic valve with mild aortic valve leaflet calcification. No regurgitation noted. Trace aortic stenosis. Aortic valve mean gradient of 7 mmHg, Vmax of 2.0 m/s. Calculated aortic valve area by continuity equation is 2.9 cm. Trace tricuspid regurgitation. Estimated pulmonary artery systolic pressure is 33  mmHg.  EKG 09/17/2019: Sinus rhythm 72 bpm. Short PR interval. Cannot exclude old inferior infarct.   Renal artery duplex 03/26/2018: 1. No Doppler ultrasound evidence of hemodynamically significant renal artery stenosis. If there is continued clinical concern, renal MRA (lower radiation risk, can be performed noncontrast in the setting of renal dysfunction) and CTA ( higher spatial resolution) represent more accurate studies, which are additionally more sensitive to the detection of duplicated renal arteries. 2. 3.7 cm echogenic right hepatic lesion, possibly benign hemangioma but incompletely characterized. Recommend liver protocol MRI or CT with contrast for possibly diagnostic characterization.  Recent labs: 12/23/2019: Glucose 167, BUN/Cr 11/1.05. EGFR 72. Na/K 136/4.5.   10/24/2019: Glucose 240. BUN/Cr 9/1.01. eGFR 75. Na/K 139/4.0. Rest of the CMP normal.   06/14/2019: Glucose 121. BUN/Cr 10/1.07. eGFR 68. Na/K 130/3.7. Rest of the CMP normal.  H/H 12.7/37.2. MCV 82. Platelets 224 Chol 169, TG 188, HDL 66, LDL 65.  HbA1C 7.0%  Review of Systems  Cardiovascular: Negative for chest pain, dyspnea on exertion, leg swelling, palpitations and syncope.         Vitals:   04/10/20 1022  BP: (!) 150/71  Pulse: (!) 56  Resp: 17  SpO2: 95%     Body mass index is 30.67 kg/m. Filed Weights   04/10/20 1022  Weight: 190 lb (86.2 kg)     Objective:   Physical Exam  Constitutional: No distress.  Neck: No JVD present.  Cardiovascular: Normal rate, regular rhythm and intact distal pulses.  Murmur heard.  Harsh midsystolic murmur is present with a grade of 2/6 at the upper right sternal border radiating to the neck. Pulmonary/Chest: Effort normal and breath sounds normal. He has no wheezes. He has no rales.  Musculoskeletal:        General: No edema.  Nursing note and vitals reviewed.         Assessment & Recommendations:   69 y.o. Caucasian male with  hypertension, type 2 DM, leg edema, h/o HCTZ induced hyponatremia.  Leg edema: Completely resolved.  Continue lasix 40 mg in am, 20 mg in pm, K 10 mEq daily  Mild aortic stenosis: Mild aortic stenosis, grade 1 diastolic dysfunction and mild pulmonary hypertension.   Asymptomatic. Repeat echocardiogram in 1 year. Will order at next visit.   Hypertension: Well controlled. Will consolidate therapy by increasing coreg to 25 mg bid, stop hydralazine.  Continue amlodipine 5 mg daily, olmesartan 40 mg daily.  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 Repeat echocardiogram in 1 year,  Hyponatremia: Resolved after stopping hydrochlorothiazide.  F/u 1 year after echcoardiogrma  [name], MD [facility] [place] Pager: [phone] Office: [phone] If no answer Cell [phone]

## 2020-04-21 ENCOUNTER — Telehealth: Payer: Self-pay

## 2020-04-22 NOTE — Telephone Encounter (Signed)
Error

## 2020-07-16 ENCOUNTER — Telehealth: Payer: Self-pay

## 2020-07-16 NOTE — Telephone Encounter (Signed)
It should not. Recommend resuming hydralazine 25 mg TID for now. Please send 90 pillsX1 refill.   Thanks MJP

## 2020-07-16 NOTE — Telephone Encounter (Signed)
Telephone encounter:  Reason for call: Pt called to say that his BP has been  Quite high For the last few weeks since august 17-19th  Tuesday it was 190 /88  Yesterday morning  161 /94 pulse 63 Yesterday afternoon 188 /98 pulse 58 Pt wanted to know if metformin increase could make it high. He is taking 500 mg 4 times away.    Usual provider: MP  Last office visit: 04/10/20  Next office visit: 04/09/21 Last hospitalization: 02/16/2016    Current Outpatient Medications on File Prior to Visit  Medication Sig Dispense Refill  . acetaminophen (TYLENOL) 500 MG tablet Take 500 mg by mouth every 6 (six) hours as needed for moderate pain.    Marland Kitchen amLODipine (NORVASC) 10 MG tablet Take 5 mg by mouth every morning.    Marland Kitchen aspirin EC 81 MG tablet Take 81 mg by mouth daily.    . carvedilol (COREG) 25 MG tablet Take 1 tablet (25 mg total) by mouth 2 (two) times daily with a meal. 90 tablet 3  . famotidine (PEPCID) 20 MG tablet Take 20 mg by mouth daily.    . fluticasone (FLONASE) 50 MCG/ACT nasal spray Place 2 sprays into both nostrils every morning.    . metFORMIN (GLUCOPHAGE) 500 MG tablet Take 1,000 mg by mouth 2 (two) times daily with a meal. 2 in am , 2 in evening    . olmesartan (BENICAR) 40 MG tablet Take 1 tablet (40 mg total) by mouth daily. 90 tablet 3  . simvastatin (ZOCOR) 40 MG tablet Take 20 mg by mouth every morning.    . tamsulosin (FLOMAX) 0.4 MG CAPS capsule Take 0.4 mg by mouth daily.    Marland Kitchen triamcinolone cream (KENALOG) 0.1 % Apply 1 application topically daily as needed (eczema).     No current facility-administered medications on file prior to visit.

## 2020-07-27 ENCOUNTER — Other Ambulatory Visit: Payer: Self-pay

## 2020-07-27 MED ORDER — HYDRALAZINE HCL 25 MG PO TABS: 25.0000 mg | ORAL_TABLET | Freq: Three times a day (TID) | ORAL | 1 refills | Status: DC

## 2020-08-20 ENCOUNTER — Ambulatory Visit: Payer: Medicare Other | Admitting: Cardiology

## 2020-08-20 ENCOUNTER — Encounter: Payer: Self-pay | Admitting: Cardiology

## 2020-08-20 ENCOUNTER — Other Ambulatory Visit: Payer: Self-pay

## 2020-08-20 VITALS — BP 183/96 | HR 60 | Resp 17 | Ht 66.0 in | Wt 172.0 lb

## 2020-08-20 DIAGNOSIS — I10 Essential (primary) hypertension: Secondary | ICD-10-CM

## 2020-08-20 DIAGNOSIS — E781 Pure hyperglyceridemia: Secondary | ICD-10-CM

## 2020-08-20 DIAGNOSIS — I350 Nonrheumatic aortic (valve) stenosis: Secondary | ICD-10-CM

## 2020-08-20 MED ORDER — HYDRALAZINE HCL 50 MG PO TABS: 50.0000 mg | ORAL_TABLET | Freq: Three times a day (TID) | ORAL | 3 refills | Status: DC

## 2020-08-20 NOTE — Progress Notes (Signed)
Patient referred by [name],* for leg edema  Subjective:   Joseph Steele, male    DOB: 09-06-1951, 69 y.o.   MRN: [id number]   Chief Complaint  Patient presents with   Hypertension   Follow-up    HPI  69 y.o. Caucasian male with hypertension, type 2 DM, leg edema, h/o HCTZ induced hyponatremia.  Patient has had recent increase in his blood pressure numbers. This coincides with his right lower back and hip pain. He denies chest pain, shortness of breath, palpitations, orthopnea, PND, TIA/syncope. Leg edema is now improved.    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  Since July 2020, he has noticed worsening bilateral leg swelling.  He denies any shortness of breath, orthopnea, PND, chest pain symptoms.  Also denies any presyncope or syncope.  He was recently started on Lasix 20 mg once daily.  He has not noticed any significant change in his symptoms.  Tel encounter 09/27/19: Discussed echocardiogram, serum and urine osmolality results with the patient and his wife.  Mild HFpEF with mild pulmonary hypertension and mild aortic stenosis.  Dilutional hyponatremia, as evident on serum and urine osmolality. Recommendeded the following. Stop olmesartan-HCTZ 40-25 mg daily.  Start olmesartan 40 mg daily alone.  Reduce Lasix to 20 mg 1 in the morning, 1 in the afternoon.  If blood pressure stays greater than 140/80 mmHg, as long as heart rate is greater than 60, patient can take metoprolol tartrate 50 mg twice daily.  Repeat BMP in first week of December.  Follow-up in the office on 10/24/2019.    Current Outpatient Medications on File Prior to Visit  Medication Sig Dispense Refill   acetaminophen (TYLENOL) 500 MG tablet Take 500 mg by mouth every 6 (six) hours as needed for moderate pain.     amLODipine (NORVASC) 10 MG tablet Take 5 mg by mouth every morning.     aspirin EC 81 MG  tablet Take 81 mg by mouth daily.     carvedilol (COREG) 25 MG tablet Take 1 tablet (25 mg total) by mouth 2 (two) times daily with a meal. 90 tablet 3   famotidine (PEPCID) 20 MG tablet Take 20 mg by mouth daily.     fluticasone (FLONASE) 50 MCG/ACT nasal spray Place 2 sprays into both nostrils every morning.     hydrALAZINE (APRESOLINE) 25 MG tablet Take 1 tablet (25 mg total) by mouth 3 (three) times daily. 90 tablet 1   metFORMIN (GLUCOPHAGE) 500 MG tablet Take 500 mg by mouth 4 (four) times daily. Per patient this is how he is taking it     olmesartan (BENICAR) 40 MG tablet Take 1 tablet (40 mg total) by mouth daily. 90 tablet 3   simvastatin (ZOCOR) 40 MG tablet Take 20 mg by mouth every morning.     tamsulosin (FLOMAX) 0.4 MG CAPS capsule Take 0.4 mg by mouth daily.     triamcinolone cream (KENALOG) 0.1 % Apply 1 application topically daily as needed (eczema).     No current facility-administered medications on file prior to visit.    Cardiovascular studies:  EKG 08/20/2020: Sinus rhythm 57 bpm Cannot exclude old inferior infarct  Echocardiogram 09/23/2019: Normal LV systolic function with EF 65%. Moderate concentric hypertrophy of the left ventricle. Left ventricle cavity is normal in size. Normal global wall motion. Doppler evidence of grade I (impaired) diastolic dysfunction, normal LAP. Calculated EF 65%. Left atrial  cavity is mildly dilated. Trileaflet aortic valve with mild aortic valve leaflet calcification. No regurgitation noted. Trace aortic stenosis. Aortic valve mean gradient of 7 mmHg, Vmax of 2.0 m/s. Calculated aortic valve area by continuity equation is 2.9 cm. Trace tricuspid regurgitation. Estimated pulmonary artery systolic pressure is 33 mmHg.  Renal artery duplex 03/26/2018: 1. No Doppler ultrasound evidence of hemodynamically significant renal artery stenosis. If there is continued clinical concern, renal MRA (lower radiation risk, can be performed  noncontrast in the setting of renal dysfunction) and CTA ( higher spatial resolution) represent more accurate studies, which are additionally more sensitive to the detection of duplicated renal arteries. 2. 3.7 cm echogenic right hepatic lesion, possibly benign hemangioma but incompletely characterized. Recommend liver protocol MRI or CT with contrast for possibly diagnostic characterization.  Recent labs: 06/05/2020: Glucose 125, BUN/Cr 13/1.0. EGFR 72. HbA1C 7.3% Chol 18, TG 332, HDL 36, LDL 69 TSH 1.5 normal   12/23/2019: Glucose 167, BUN/Cr 11/1.05. EGFR 72. Na/K 136/4.5.   10/24/2019: Glucose 240. BUN/Cr 9/1.01. eGFR 75. Na/K 139/4.0. Rest of the CMP normal.   06/14/2019: Glucose 121. BUN/Cr 10/1.07. eGFR 68. Na/K 130/3.7. Rest of the CMP normal.  H/H 12.7/37.2. MCV 82. Platelets 224 Chol 169, TG 188, HDL 66, LDL 65.  HbA1C 7.0%  Review of Systems  Cardiovascular: Negative for chest pain, dyspnea on exertion, leg swelling, palpitations and syncope.         Vitals:   08/20/20 1145  BP: (!) 183/96  Pulse: 60  Resp: 17  SpO2: 98%     Body mass index is 27.76 kg/m. Filed Weights   08/20/20 1145  Weight: 172 lb (78 kg)     Objective:   Physical Exam Vitals and nursing note reviewed.  Constitutional:      General: He is not in acute distress. Neck:     Vascular: No JVD.  Cardiovascular:     Rate and Rhythm: Normal rate and regular rhythm.     Pulses: Intact distal pulses.     Heart sounds: Murmur heard.  Harsh midsystolic murmur is present with a grade of 2/6 at the upper right sternal border radiating to the neck.   Pulmonary:     Effort: Pulmonary effort is normal.     Breath sounds: Normal breath sounds. No wheezing or rales.           Assessment & Recommendations:   69 y.o. Caucasian male with hypertension, type 2 DM, leg edema, h/o HCTZ induced hyponatremia.  Hypertension: Recent increase in blood pressure, likely due to pain. Back/hip  pain likely of musculoskeletal etiology in the setting of normal vascular exam. Increase hydralazine to 50 mg tid.  Continue coreg to 25 mg bid, amlodipine 5 mg daily, olmesartan 40 mg daily.  Hypertriglyceridemia: Likely driven by type 2 DM. Repeat fasting lipid panel before next visit.   Mild aortic stenosis: Mild aortic stenosis, grade 1 diastolic dysfunction and mild pulmonary hypertension.   Asymptomatic. 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 [place] Pager: [phone] Office: [phone] If no answer Cell [phone]

## 2020-09-01 ENCOUNTER — Other Ambulatory Visit: Payer: Self-pay | Admitting: Cardiology

## 2020-09-01 DIAGNOSIS — I10 Essential (primary) hypertension: Secondary | ICD-10-CM

## 2020-09-04 LAB — LIPID PANEL
Chol/HDL Ratio: 3.5 ratio (ref 0.0–5.0)
Cholesterol, Total: 151 mg/dL (ref 100–199)
HDL: 43 mg/dL (ref >39)
LDL Chol Calc (NIH): 74 mg/dL (ref 0–99)
Triglycerides: 207 mg/dL — ABNORMAL HIGH (ref 0–149)
VLDL Cholesterol Cal: 34 mg/dL (ref 5–40)

## 2020-09-17 ENCOUNTER — Other Ambulatory Visit: Payer: Self-pay

## 2020-09-17 ENCOUNTER — Encounter: Payer: Self-pay | Admitting: Cardiology

## 2020-09-17 ENCOUNTER — Telehealth: Payer: Self-pay

## 2020-09-17 ENCOUNTER — Ambulatory Visit: Payer: Medicare Other | Admitting: Cardiology

## 2020-09-17 VITALS — BP 175/80 | HR 61 | Resp 16 | Ht 66.0 in | Wt 177.0 lb

## 2020-09-17 DIAGNOSIS — I10 Essential (primary) hypertension: Secondary | ICD-10-CM

## 2020-09-17 DIAGNOSIS — I350 Nonrheumatic aortic (valve) stenosis: Secondary | ICD-10-CM

## 2020-09-17 MED ORDER — CARVEDILOL 25 MG PO TABS: 37.5000 mg | ORAL_TABLET | Freq: Two times a day (BID) | ORAL | 3 refills | Status: DC

## 2020-09-17 MED ORDER — OLMESARTAN MEDOXOMIL 40 MG PO TABS: 40.0000 mg | ORAL_TABLET | ORAL | 3 refills | Status: AC

## 2020-09-17 NOTE — Telephone Encounter (Signed)
Patient was seen today and wanted to know about his labs, he forgot to ask.

## 2020-09-17 NOTE — Progress Notes (Signed)
Patient referred by [name],* for leg edema  Subjective:   Joseph Steele, male    DOB: 1951/06/24, 69 y.o.   MRN: [id number]   Chief Complaint  Patient presents with  . Hypertension  . Follow-up    4 week    69 y.o. Caucasian male with hypertension, type 2 DM, leg edema, h/o HCTZ induced hyponatremia.  Blood pressure numbers have improved in the morning, but continues to be elevated in the evening.  Patient currently takes carvedilol twice a day, hydralazine and olmesartan in the morning.  Reviewed recent contact with the patient, details below.  Current Outpatient Medications on File Prior to Visit  Medication Sig Dispense Refill  . acetaminophen (TYLENOL) 500 MG tablet Take 500 mg by mouth every 6 (six) hours as needed for moderate pain.    Marland Kitchen amLODipine (NORVASC) 10 MG tablet Take 5 mg by mouth every morning.    Marland Kitchen aspirin EC 81 MG tablet Take 81 mg by mouth daily.    . carvedilol (COREG) 25 MG tablet TAKE 1 TABLET BY MOUTH  TWICE DAILY WITH A MEAL 180 tablet 3  . famotidine (PEPCID) 20 MG tablet Take 20 mg by mouth daily.    . fluticasone (FLONASE) 50 MCG/ACT nasal spray Place 2 sprays into both nostrils every morning.    . hydrALAZINE (APRESOLINE) 50 MG tablet Take 1 tablet (50 mg total) by mouth 3 (three) times daily. 90 tablet 3  . metFORMIN (GLUCOPHAGE) 500 MG tablet Take 1,000 mg by mouth 2 (two) times daily with a meal. Per patient this is how he is taking it    . olmesartan (BENICAR) 40 MG tablet Take 1 tablet (40 mg total) by mouth daily. 90 tablet 3  . simvastatin (ZOCOR) 40 MG tablet Take 20 mg by mouth every morning.    . tamsulosin (FLOMAX) 0.4 MG CAPS capsule Take 0.4 mg by mouth daily.    Marland Kitchen triamcinolone cream (KENALOG) 0.1 % Apply 1 application topically daily as needed (eczema).     No current facility-administered medications on file prior to visit.    Cardiovascular studies:  EKG 08/20/2020: Sinus rhythm 57 bpm Cannot exclude old  inferior infarct  Echocardiogram 09/23/2019: Normal LV systolic function with EF 65%. Moderate concentric hypertrophy of the left ventricle. Left ventricle cavity is normal in size. Normal global wall motion. Doppler evidence of grade I (impaired) diastolic dysfunction, normal LAP. Calculated EF 65%. Left atrial cavity is mildly dilated. Trileaflet aortic valve with mild aortic valve leaflet calcification. No regurgitation noted. Trace aortic stenosis. Aortic valve mean gradient of 7 mmHg, Vmax of 2.0 m/s. Calculated aortic valve area by continuity equation is 2.9 cm. Trace tricuspid regurgitation. Estimated pulmonary artery systolic pressure is 33 mmHg.  Renal artery duplex 03/26/2018: 1. No Doppler ultrasound evidence of hemodynamically significant renal artery stenosis. If there is continued clinical concern, renal MRA (lower radiation risk, can be performed noncontrast in the setting of renal dysfunction) and CTA ( higher spatial resolution) represent more accurate studies, which are additionally more sensitive to the detection of duplicated renal arteries. 2. 3.7 cm echogenic right hepatic lesion, possibly benign hemangioma but incompletely characterized. Recommend liver protocol MRI or CT with contrast for possibly diagnostic characterization.  Recent labs: Chol 151, TG 207, HDL 43, LDL 74  06/05/2020: Glucose 125, BUN/Cr 13/1.0. EGFR 72. HbA1C 7.3% Chol 180, TG 332, HDL 36, LDL 69 TSH 1.5 normal   12/23/2019: Glucose 167, BUN/Cr 11/1.05. EGFR 72. Na/K 136/4.5.  10/24/2019: Glucose 240. BUN/Cr 9/1.01. eGFR 75. Na/K 139/4.0. Rest of the CMP normal.   06/14/2019: Glucose 121. BUN/Cr 10/1.07. eGFR 68. Na/K 130/3.7. Rest of the CMP normal.  H/H 12.7/37.2. MCV 82. Platelets 224 Chol 169, TG 188, HDL 66, LDL 65.  HbA1C 7.0%  Review of Systems  Cardiovascular: Negative for chest pain, dyspnea on exertion, leg swelling, palpitations and syncope.         Vitals:   09/17/20  1033  BP: (!) 175/80  Pulse: 61  Resp: 16  SpO2: 94%     Body mass index is 28.57 kg/m. Filed Weights   09/17/20 1033  Weight: 177 lb (80.3 kg)     Objective:   Physical Exam Vitals and nursing note reviewed.  Constitutional:      General: He is not in acute distress. Neck:     Vascular: No JVD.  Cardiovascular:     Rate and Rhythm: Normal rate and regular rhythm.     Pulses: Intact distal pulses.     Heart sounds: Murmur heard.  Harsh midsystolic murmur is present with a grade of 2/6 at the upper right sternal border radiating to the neck.   Pulmonary:     Effort: Pulmonary effort is normal.     Breath sounds: Normal breath sounds. No wheezing or rales.           Assessment & Recommendations:   69 y.o. Caucasian male with hypertension, type 2 DM, leg edema, h/o HCTZ induced hyponatremia.  Resistant hypertension: Increase carvedilol to 37.5 mg bid. Take hydralazine 50 mg tid. Take olmesartan 40 mg and amlodipine 10 mg in the evening,   Hypertriglyceridemia: Improved. COnitnue DM treatment  Mild aortic stenosis: Mild aortic stenosis, grade 1 diastolic dysfunction and mild pulmonary hypertension.   Asymptomatic. 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 Pager: [phone] Office: [phone] If no answer Cell [phone]

## 2020-10-15 ENCOUNTER — Encounter: Payer: Self-pay | Admitting: Cardiology

## 2020-10-15 ENCOUNTER — Other Ambulatory Visit: Payer: Self-pay

## 2020-10-15 ENCOUNTER — Ambulatory Visit: Payer: Medicare Other | Admitting: Cardiology

## 2020-10-15 VITALS — BP 177/85 | HR 58 | Resp 16 | Ht 66.0 in | Wt 179.0 lb

## 2020-10-15 DIAGNOSIS — I350 Nonrheumatic aortic (valve) stenosis: Secondary | ICD-10-CM

## 2020-10-15 DIAGNOSIS — I10 Essential (primary) hypertension: Secondary | ICD-10-CM

## 2020-10-15 MED ORDER — BIDIL 20-37.5 MG PO TABS: 2.0000 | ORAL_TABLET | Freq: Three times a day (TID) | ORAL | 2 refills | Status: DC

## 2020-10-15 NOTE — Progress Notes (Signed)
Patient referred by [name],* for leg edema  Subjective:   Joseph Steele, male    DOB: 04/25/1951, 69 y.o.   MRN: [id number]   Chief Complaint  Patient presents with  . Hypertension  . Follow-up    4 week    69 y.o. Caucasian male with hypertension, type 2 DM, leg edema, h/o HCTZ induced hyponatremia.  Blood pressure remains uncontrolled. He denies chest pain, shortness of breath, palpitations, leg edema, orthopnea, PND, TIA/syncope.   Current Outpatient Medications on File Prior to Visit  Medication Sig Dispense Refill  . acetaminophen (TYLENOL) 500 MG tablet Take 500 mg by mouth every 6 (six) hours as needed for moderate pain.    Marland Kitchen amLODipine (NORVASC) 10 MG tablet Take 10 mg by mouth every evening.    Marland Kitchen aspirin EC 81 MG tablet Take 81 mg by mouth daily.    . carvedilol (COREG) 25 MG tablet Take 1.5 tablets (37.5 mg total) by mouth 2 (two) times daily with a meal. 180 tablet 3  . famotidine (PEPCID) 20 MG tablet Take 20 mg by mouth daily.    . fluticasone (FLONASE) 50 MCG/ACT nasal spray Place 2 sprays into both nostrils every morning.    . hydrALAZINE (APRESOLINE) 50 MG tablet Take 1 tablet (50 mg total) by mouth 3 (three) times daily. 90 tablet 3  . metFORMIN (GLUCOPHAGE) 500 MG tablet Take 1,000 mg by mouth 2 (two) times daily with a meal. Per patient this is how he is taking it    . olmesartan (BENICAR) 40 MG tablet Take 1 tablet (40 mg total) by mouth every other day. 90 tablet 3  . simvastatin (ZOCOR) 40 MG tablet Take 20 mg by mouth every morning.    . tamsulosin (FLOMAX) 0.4 MG CAPS capsule Take 0.4 mg by mouth daily.    Marland Kitchen triamcinolone cream (KENALOG) 0.1 % Apply 1 application topically daily as needed (eczema).     No current facility-administered medications on file prior to visit.    Cardiovascular studies:  EKG 08/20/2020: Sinus rhythm 57 bpm Cannot exclude old inferior infarct  Echocardiogram 09/23/2019: Normal LV systolic function with EF  65%. Moderate concentric hypertrophy of the left ventricle. Left ventricle cavity is normal in size. Normal global wall motion. Doppler evidence of grade I (impaired) diastolic dysfunction, normal LAP. Calculated EF 65%. Left atrial cavity is mildly dilated. Trileaflet aortic valve with mild aortic valve leaflet calcification. No regurgitation noted. Trace aortic stenosis. Aortic valve mean gradient of 7 mmHg, Vmax of 2.0 m/s. Calculated aortic valve area by continuity equation is 2.9 cm. Trace tricuspid regurgitation. Estimated pulmonary artery systolic pressure is 33 mmHg.  Renal artery duplex 03/26/2018: 1. No Doppler ultrasound evidence of hemodynamically significant renal artery stenosis. If there is continued clinical concern, renal MRA (lower radiation risk, can be performed noncontrast in the setting of renal dysfunction) and CTA ( higher spatial resolution) represent more accurate studies, which are additionally more sensitive to the detection of duplicated renal arteries. 2. 3.7 cm echogenic right hepatic lesion, possibly benign hemangioma but incompletely characterized. Recommend liver protocol MRI or CT with contrast for possibly diagnostic characterization.  Recent labs: 09/03/2020: Chol 151, TG 207, HDL 43, LDL 74  06/05/2020: Glucose 125, BUN/Cr 13/1.0. EGFR 72. HbA1C 7.3% Chol 180, TG 332, HDL 36, LDL 69 TSH 1.5 normal   12/23/2019: Glucose 167, BUN/Cr 11/1.05. EGFR 72. Na/K 136/4.5.   10/24/2019: Glucose 240. BUN/Cr 9/1.01. eGFR 75. Na/K 139/4.0. Rest of the CMP normal.  06/14/2019: Glucose 121. BUN/Cr 10/1.07. eGFR 68. Na/K 130/3.7. Rest of the CMP normal.  H/H 12.7/37.2. MCV 82. Platelets 224 Chol 169, TG 188, HDL 66, LDL 65.  HbA1C 7.0%  Review of Systems  Cardiovascular: Negative for chest pain, dyspnea on exertion, leg swelling, palpitations and syncope.        Vitals:   10/15/20 1038  BP: (!) 177/85  Pulse: (!) 58  Resp: 16  SpO2: 95%     Body  mass index is 28.89 kg/m. Filed Weights   10/15/20 1038  Weight: 179 lb (81.2 kg)     Objective:   Physical Exam Vitals and nursing note reviewed.  Constitutional:      General: He is not in acute distress. Neck:     Vascular: No JVD.  Cardiovascular:     Rate and Rhythm: Normal rate and regular rhythm.     Pulses: Intact distal pulses.     Heart sounds: Murmur heard.  Harsh midsystolic murmur is present with a grade of 2/6 at the upper right sternal border radiating to the neck.   Pulmonary:     Effort: Pulmonary effort is normal.     Breath sounds: Normal breath sounds. No wheezing or rales.           Assessment & Recommendations:   69 y.o. Caucasian male with hypertension, type 2 DM, leg edema, h/o HCTZ induced hyponatremia.  Resistant hypertension: Continue carvedilol to 37.5 mg bid, olmesartan 40 mg daily and amlodipine 10 mg daily. Change hydralazine 50 mg tid to Bidil 20-37.5 mg 2 tab tid. Check BMP, renin/aldosterone. Check CTA to exclude renal artery stenosis.  Hypertriglyceridemia: Improved. Conitnue DM treatment  Mild aortic stenosis: Mild aortic stenosis, grade 1 diastolic dysfunction and mild pulmonary hypertension.   Asymptomatic. Repeat echocardiogram in 03/2021.  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 Pager: [phone] Office: [phone] If no answer Cell [phone]

## 2020-10-22 LAB — ALDOSTERONE + RENIN ACTIVITY W/ RATIO
ALDOS/RENIN RATIO: >15 (ref 0.0–30.0)
ALDOSTERONE: 2.5 ng/dL (ref 0.0–30.0)
Renin: <0.167 ng/mL/hr — ABNORMAL LOW (ref 0.167–5.380)

## 2020-10-22 MED ORDER — SPIRONOLACTONE 50 MG PO TABS: 50.0000 mg | ORAL_TABLET | Freq: Every day | ORAL | 3 refills | Status: DC

## 2020-10-22 NOTE — Addendum Note (Signed)
Addended by: [name] on: 10/22/2020 08:41 AM   Modules accepted: Orders

## 2020-10-22 NOTE — Progress Notes (Signed)
Reviewed results. Recommend adding a medication spironolactone 50 mg daily, ordered. Please check BMP in 1 week, ordered. Please move follow up to sometime in late December, if possible.  Thanks MJP

## 2020-10-23 NOTE — Progress Notes (Signed)
Patient is aware of results. He asks that you send in Spironolactone to [facility] on [address] not [facility] and is aware to go back in 1 week to repeat BMP. I also transferred call to [facility] ([facility]) to see about scheduling a sooner appointment.

## 2020-10-23 NOTE — Progress Notes (Signed)
Called patient, Joseph Steele, Joseph Steele

## 2020-10-30 LAB — BASIC METABOLIC PANEL
BUN/Creatinine Ratio: 11 (ref 10–24)
BUN: 12 mg/dL (ref 8–27)
CO2: 22 mmol/L (ref 20–29)
Calcium: 9.2 mg/dL (ref 8.6–10.2)
Chloride: 102 mmol/L (ref 96–106)
Creatinine, Ser: 1.13 mg/dL (ref 0.76–1.27)
GFR calc Af Amer: 75 mL/min/{1.73_m2} (ref >59)
GFR calc non Af Amer: 65 mL/min/{1.73_m2} (ref >59)
Glucose: 141 mg/dL — ABNORMAL HIGH (ref 65–99)
Potassium: 4.2 mmol/L (ref 3.5–5.2)
Sodium: 138 mmol/L (ref 134–144)

## 2020-11-03 ENCOUNTER — Ambulatory Visit
Admission: RE | Admit: 2020-11-03 | Discharge: 2020-11-03 | Disposition: A | Discharge status: Home / Self Care | Payer: Medicare Other | Source: Ambulatory Visit | Attending: Cardiology | Admitting: Cardiology

## 2020-11-03 DIAGNOSIS — I10 Essential (primary) hypertension: Secondary | ICD-10-CM

## 2020-11-03 MED ORDER — IOPAMIDOL (ISOVUE-370) INJECTION 76%
75.0000 mL | Freq: Once | INTRAVENOUS | Status: AC | PRN
  Administered 2020-11-03: 16:00:00 75 mL via INTRAVENOUS

## 2020-11-10 DIAGNOSIS — R932 Abnormal findings on diagnostic imaging of liver and biliary tract: Secondary | ICD-10-CM | POA: Insufficient documentation

## 2020-11-10 DIAGNOSIS — E278 Other specified disorders of adrenal gland: Secondary | ICD-10-CM | POA: Insufficient documentation

## 2020-11-10 NOTE — Progress Notes (Addendum)
Patient referred by [name],* for leg edema  Subjective:   Joseph Steele, male    DOB: 12-13-1950, 69 y.o.   MRN: [id number]   Chief Complaint  Patient presents with  . Hypertension  . Follow-up    69 y.o. Caucasian male with hypertension, type 2 DM, leg edema, h/o HCTZ induced hyponatremia.  Patient's blood pressure has improved, but still remains elevated. CTA abdomen/pelvis showed <50% renal artery atherosclerosis. 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 I also showed nodular liver surface s/o cirrhosis.   Blood pressure remains uncontrolled. He denies chest pain, shortness of breath, palpitations, leg edema, orthopnea, PND, TIA/syncope.   Current Outpatient Medications on File Prior to Visit  Medication Sig Dispense Refill  . acetaminophen (TYLENOL) 500 MG tablet Take 500 mg by mouth every 6 (six) hours as needed for moderate pain.    Marland Kitchen amLODipine (NORVASC) 10 MG tablet Take 10 mg by mouth every evening.    Marland Kitchen aspirin EC 81 MG tablet Take 81 mg by mouth daily.    . carvedilol (COREG) 25 MG tablet Take 1.5 tablets (37.5 mg total) by mouth 2 (two) times daily with a meal. 180 tablet 3  . famotidine (PEPCID) 20 MG tablet Take 20 mg by mouth daily.    . fluticasone (FLONASE) 50 MCG/ACT nasal spray Place 2 sprays into both nostrils every morning.    . isosorbide-hydrALAZINE (BIDIL) 20-37.5 MG tablet Take 2 tablets by mouth 3 (three) times daily. 180 tablet 2  . metFORMIN (GLUCOPHAGE) 500 MG tablet Take 1,000 mg by mouth 2 (two) times daily with a meal. Per patient this is how he is taking it    . olmesartan (BENICAR) 40 MG tablet Take 1 tablet (40 mg total) by mouth every other day. 90 tablet 3  . simvastatin (ZOCOR) 40 MG tablet Take 20 mg by mouth every morning.    Marland Kitchen spironolactone (ALDACTONE) 50 MG tablet Take 1 tablet (50 mg total) by mouth daily. 30 tablet 3  . tamsulosin (FLOMAX) 0.4 MG CAPS capsule  Take 0.4 mg by mouth daily.    Marland Kitchen triamcinolone cream (KENALOG) 0.1 % Apply 1 application topically daily as needed (eczema).     No current facility-administered medications on file prior to visit.    Cardiovascular studies:  CTA abdomen/pelvis 10/2020: 1. Mild atherosclerotic disease of the renal arteries resulting in less than 50% stenosis. 2. Indeterminate left adrenal nodule measuring approximately 2.4 cm. Follow-up with an adrenal protocol CT is recommended or comparison to an outside prior study to confirm stability. 3. There is a right-sided 3.5 cm adrenal myelolipoma. This likely explains the hyperechoic 3.7 cm lesion seen on the patient's prior ultrasound from 2019. 4. Nodular liver surface, suggestive of cirrhosis.  Aortic Atherosclerosis (ICD10-I70.0).  EKG 08/20/2020: Sinus rhythm 57 bpm Cannot exclude old inferior infarct  Echocardiogram 09/23/2019: Normal LV systolic function with EF 65%. Moderate concentric hypertrophy of the left ventricle. Left ventricle cavity is normal in size. Normal global wall motion. Doppler evidence of grade I (impaired) diastolic dysfunction, normal LAP. Calculated EF 65%. Left atrial cavity is mildly dilated. Trileaflet aortic valve with mild aortic valve leaflet calcification. No regurgitation noted. Trace aortic stenosis. Aortic valve mean gradient of 7 mmHg, Vmax of 2.0 m/s. Calculated aortic valve area by continuity equation is 2.9 cm. Trace tricuspid regurgitation. Estimated pulmonary artery systolic pressure is 33 mmHg.  Renal artery duplex 03/26/2018: 1. No Doppler ultrasound  evidence of hemodynamically significant renal artery stenosis. If there is continued clinical concern, renal MRA (lower radiation risk, can be performed noncontrast in the setting of renal dysfunction) and CTA ( higher spatial resolution) represent more accurate studies, which are additionally more sensitive to the detection of duplicated renal arteries. 2.  3.7 cm echogenic right hepatic lesion, possibly benign hemangioma but incompletely characterized. Recommend liver protocol MRI or CT with contrast for possibly diagnostic characterization.  Recent labs: 09/03/2020: Chol 151, TG 207, HDL 43, LDL 74  06/05/2020: Glucose 125, BUN/Cr 13/1.0. EGFR 72. HbA1C 7.3% Chol 180, TG 332, HDL 36, LDL 69 TSH 1.5 normal   12/23/2019: Glucose 167, BUN/Cr 11/1.05. EGFR 72. Na/K 136/4.5.   10/24/2019: Glucose 240. BUN/Cr 9/1.01. eGFR 75. Na/K 139/4.0. Rest of the CMP normal.   06/14/2019: Glucose 121. BUN/Cr 10/1.07. eGFR 68. Na/K 130/3.7. Rest of the CMP normal.  H/H 12.7/37.2. MCV 82. Platelets 224 Chol 169, TG 188, HDL 66, LDL 65.  HbA1C 7.0%  Review of Systems  Cardiovascular: Negative for chest pain, dyspnea on exertion, leg swelling, palpitations and syncope.        Vitals:   11/11/20 1132  BP: (!) 164/84  Pulse: (!) 57  SpO2: 97%     Body mass index is 29.05 kg/m. Filed Weights   11/11/20 1132  Weight: 180 lb (81.6 kg)     Objective:   Physical Exam Vitals and nursing note reviewed.  Constitutional:      General: He is not in acute distress. Neck:     Vascular: No JVD.  Cardiovascular:     Rate and Rhythm: Normal rate and regular rhythm.     Pulses: Intact distal pulses.     Heart sounds: Murmur heard.   Harsh midsystolic murmur is present with a grade of 2/6 at the upper right sternal border radiating to the neck.   Pulmonary:     Effort: Pulmonary effort is normal.     Breath sounds: Normal breath sounds. No wheezing or rales.           Assessment & Recommendations:   69 y.o. Caucasian male with resistant hypertension, type 2 DM, leg edema, h/o HCTZ induced hyponatremia.  Resistant hypertension: Continue carvedilol to 37.5 mg bid, olmesartan 40 mg daily and amlodipine 10 mg daily, Bidil 20-37.5 mg 2 tab tid. I will discuss with his PCP re: workup and management of adrenal nodules, myolipoma, which  could be causes for secondary hypertension Will go ahead and refer to endocrinologist Dr. Buddy Duty  Hypertriglyceridemia: Improved. Conitnue DM treatment  Mild aortic stenosis: Mild aortic stenosis, grade 1 diastolic dysfunction and mild pulmonary hypertension.   Asymptomatic. Repeat echocardiogram in 03/2021.  Abnormal CT liver: Findings s/o cirrhosis. No clinical h/o s/o cirrhosis. Defer to PCP  F/u in 3 months  Joshaua Epple Esther Hardy, MD [facility] Pager: [phone] Office: [phone] If no answer Cell [phone]

## 2020-11-11 ENCOUNTER — Ambulatory Visit: Payer: Medicare Other | Admitting: Cardiology

## 2020-11-11 ENCOUNTER — Other Ambulatory Visit: Payer: Self-pay

## 2020-11-11 ENCOUNTER — Encounter: Payer: Self-pay | Admitting: Cardiology

## 2020-11-11 VITALS — BP 164/84 | HR 57 | Ht 66.0 in | Wt 180.0 lb

## 2020-11-11 DIAGNOSIS — E278 Other specified disorders of adrenal gland: Secondary | ICD-10-CM

## 2020-11-11 DIAGNOSIS — R932 Abnormal findings on diagnostic imaging of liver and biliary tract: Secondary | ICD-10-CM

## 2020-11-11 DIAGNOSIS — I10 Essential (primary) hypertension: Secondary | ICD-10-CM

## 2020-11-11 DIAGNOSIS — I350 Nonrheumatic aortic (valve) stenosis: Secondary | ICD-10-CM

## 2020-11-11 NOTE — Addendum Note (Signed)
Addended by: Elder Negus on: 11/11/2020 03:11 PM   Modules accepted: Orders

## 2020-11-18 ENCOUNTER — Ambulatory Visit: Payer: Medicare Other | Admitting: Cardiology

## 2020-11-24 ENCOUNTER — Other Ambulatory Visit: Payer: Self-pay | Admitting: Internal Medicine

## 2020-11-24 DIAGNOSIS — E278 Other specified disorders of adrenal gland: Secondary | ICD-10-CM

## 2020-11-26 ENCOUNTER — Ambulatory Visit: Payer: Medicare Other | Admitting: Cardiology

## 2020-12-08 ENCOUNTER — Ambulatory Visit
Admission: RE | Admit: 2020-12-08 | Discharge: 2020-12-08 | Disposition: A | Discharge status: Home / Self Care | Payer: Medicare Other | Source: Ambulatory Visit | Attending: Internal Medicine | Admitting: Internal Medicine

## 2020-12-08 ENCOUNTER — Other Ambulatory Visit: Payer: Self-pay

## 2020-12-08 DIAGNOSIS — E278 Other specified disorders of adrenal gland: Secondary | ICD-10-CM

## 2020-12-08 MED ORDER — IOPAMIDOL (ISOVUE-300) INJECTION 61%
100.0000 mL | Freq: Once | INTRAVENOUS | Status: AC | PRN
  Administered 2020-12-08: 100 mL via INTRAVENOUS

## 2020-12-09 ENCOUNTER — Other Ambulatory Visit: Payer: Medicare Other

## 2020-12-14 ENCOUNTER — Other Ambulatory Visit: Payer: Self-pay

## 2020-12-14 DIAGNOSIS — I1A0 Resistant hypertension: Secondary | ICD-10-CM

## 2020-12-14 DIAGNOSIS — I10 Essential (primary) hypertension: Secondary | ICD-10-CM

## 2020-12-14 MED ORDER — BIDIL 20-37.5 MG PO TABS: 2.0000 | ORAL_TABLET | Freq: Three times a day (TID) | ORAL | 2 refills | Status: DC

## 2020-12-15 ENCOUNTER — Other Ambulatory Visit: Payer: Self-pay

## 2020-12-15 DIAGNOSIS — I10 Essential (primary) hypertension: Secondary | ICD-10-CM

## 2020-12-15 MED ORDER — BIDIL 20-37.5 MG PO TABS: 2.0000 | ORAL_TABLET | Freq: Three times a day (TID) | ORAL | 2 refills | Status: DC

## 2020-12-21 ENCOUNTER — Other Ambulatory Visit: Payer: Self-pay

## 2020-12-21 ENCOUNTER — Ambulatory Visit (INDEPENDENT_AMBULATORY_CARE_PROVIDER_SITE_OTHER): Payer: Medicare Other | Admitting: Endocrinology

## 2020-12-21 VITALS — BP 180/64 | HR 62 | Ht 67.0 in | Wt 174.8 lb

## 2020-12-21 DIAGNOSIS — E278 Other specified disorders of adrenal gland: Secondary | ICD-10-CM

## 2020-12-21 LAB — CORTISOL: Cortisol, Plasma: 7.3 ug/dL

## 2020-12-21 NOTE — Progress Notes (Addendum)
Subjective:    Patient ID: Joseph Steele, male    DOB: 1950/11/27, 70 y.o.   MRN: [id number]  HPI Pt is ref by Dr. Virgina Jock, for adrenal nodule.  He has long h/o HTN.  He has no h/o adrenal disease, kidney problems, or cancer.  He has lost 15 lbs x 2 years.   Past Medical History:  Diagnosis Date  . Diabetes mellitus   . GERD (gastroesophageal reflux disease)   . Hypertension     Past Surgical History:  Procedure Laterality Date  . COLONOSCOPY WITH PROPOFOL N/A 02/16/2016   Procedure: COLONOSCOPY WITH PROPOFOL;  Surgeon: Garlan Fair, MD;  Location: [facility];  Service: Endoscopy;  Laterality: N/A;  . INGUINAL HERNIA REPAIR     right at 70 years of age  . TONSILLECTOMY      Social History   Socioeconomic History  . Marital status: Married    Spouse name: Not on file  . Number of children: 2  . Years of education: Not on file  . Highest education level: Not on file  Occupational History  . Not on file  Tobacco Use  . Smoking status: Former Smoker    Packs/day: 2.00    Years: 10.00    Pack years: 20.00    Types: Cigarettes    Quit date: 1984    Years since quitting: 38.1  . Smokeless tobacco: Never Used  Vaping Use  . Vaping Use: Never used  Substance and Sexual Activity  . Alcohol use: Yes    Alcohol/week: 16.0 standard drinks    Types: 16 Standard drinks or equivalent per week    Comment: [id number]  . Drug use: No  . Sexual activity: Not on file  Other Topics Concern  . Not on file  Social History Narrative  . Not on file   Social Determinants of Health   Financial Resource Strain: Not on file  Food Insecurity: Not on file  Transportation Needs: Not on file  Physical Activity: Not on file  Stress: Not on file  Social Connections: Not on file  Intimate Partner Violence: Not on file    Current Outpatient Medications on File Prior to Visit  Medication Sig Dispense Refill  . acetaminophen (TYLENOL) 500 MG tablet Take 500 mg by mouth every 6 (six)  hours as needed for moderate pain.    Marland Kitchen amLODipine (NORVASC) 10 MG tablet Take 10 mg by mouth every evening.    Marland Kitchen aspirin EC 81 MG tablet Take 81 mg by mouth daily.    . carvedilol (COREG) 25 MG tablet Take 1.5 tablets (37.5 mg total) by mouth 2 (two) times daily with a meal. 180 tablet 3  . famotidine (PEPCID) 20 MG tablet Take 20 mg by mouth daily.    . fluticasone (FLONASE) 50 MCG/ACT nasal spray Place 2 sprays into both nostrils every morning.    . isosorbide-hydrALAZINE (BIDIL) 20-37.5 MG tablet Take 2 tablets by mouth 3 (three) times daily. 180 tablet 2  . metFORMIN (GLUCOPHAGE) 500 MG tablet Take 1,000 mg by mouth 2 (two) times daily with a meal. Per patient this is how he is taking it    . olmesartan (BENICAR) 40 MG tablet Take 1 tablet (40 mg total) by mouth every other day. 90 tablet 3  . simvastatin (ZOCOR) 40 MG tablet Take 20 mg by mouth every morning.    Marland Kitchen spironolactone (ALDACTONE) 50 MG tablet Take 1 tablet (50 mg total) by mouth daily. 30 tablet 3  .  tamsulosin (FLOMAX) 0.4 MG CAPS capsule Take 0.4 mg by mouth daily.    Marland Kitchen triamcinolone cream (KENALOG) 0.1 % Apply 1 application topically daily as needed (eczema).     No current facility-administered medications on file prior to visit.    Allergies  Allergen Reactions  . Escitalopram Oxalate Other (See Comments)    Family History  Problem Relation Age of Onset  . Stroke Mother   . Hypertension Mother   . Hypertension Father   . Congestive Heart Failure Father   . Diabetes Maternal Grandmother   . Diabetes Maternal Grandfather   . Diabetes Paternal Grandmother   . Diabetes Paternal Grandfather     BP (!) 180/64 (BP Location: Right Arm, Patient Position: Sitting, Cuff Size: Normal)   Pulse 62   Ht 5\' 7"  (1.702 m)   Wt 174 lb 12.8 oz (79.3 kg)   SpO2 98%   BMI 27.38 kg/m    Review of Systems He denies polyuria, n/v, palpitations, flushing, pallor, neurofibromas, and headache.      Objective:   Physical  Exam VS: see vs page GEN: no distress HEAD: head: no deformity eyes: no periorbital swelling, no proptosis external nose and ears are normal NECK: supple, thyroid is not enlarged CHEST WALL: no deformity LUNGS: clear to auscultation CV: reg rate and rhythm, no murmur.  MUSCULOSKELETAL: gait is normal and steady EXTEMITIES: no deformity.  Trace bilat leg edema NEURO:  readily moves all 4's.  sensation is intact to touch on all 4's SKIN:  Normal texture and temperature.  No rash or suspicious lesion is visible.  Not diaphoretic. NODES:  None palpable at the neck PSYCH: alert, well-oriented.  Does not appear anxious nor depressed.   Lab Results  Component Value Date   CREATININE 1.13 10/29/2020   BUN 12 10/29/2020   NA 138 10/29/2020   K 4.2 10/29/2020   CL 102 10/29/2020   CO2 22 10/29/2020   CT (1/22): LEFT adrenal lesion compatible with small LEFT adrenal adenoma based on noncontrast portion of the exam and homogeneity. Equivocal washout characteristics are of uncertain significance. Attention on MRI is suggested  I have reviewed outside records, and summarized: Pt was noted to have elevated BP, and referred here.  He had CTA to look for RAS, and adrenal adenoma was noted  PRA is undetectable Aldosterone=2.5    Assessment & Plan:  elev aldo/PRA ratio.  Uncertain relevance in view of aldo being well within normal limits. Adrenal nodule, uncertain etiology and prognosis.    Patient Instructions  Blood tests, and a 24HR urine test, are requested for you today.  We'll let you know about the results.  Please come back for a follow-up appointment in 2 months.  When you come back, we would plan to schedule an MRI of the adrenals.

## 2020-12-21 NOTE — Patient Instructions (Signed)
Blood tests, and a 24HR urine test, are requested for you today.  We'll let you know about the results.  Please come back for a follow-up appointment in 2 months.  When you come back, we would plan to schedule an MRI of the adrenals.

## 2020-12-22 LAB — FOLLICLE STIMULATING HORMONE: FSH: 9.4 m[IU]/mL (ref 1.4–18.1)

## 2020-12-23 ENCOUNTER — Other Ambulatory Visit: Payer: Self-pay

## 2020-12-23 ENCOUNTER — Other Ambulatory Visit: Payer: Medicare Other

## 2020-12-23 DIAGNOSIS — E278 Other specified disorders of adrenal gland: Secondary | ICD-10-CM

## 2020-12-28 LAB — CATECHOLAMINES, FRACTIONATED, URINE, 24 HOUR
Calc Total (E+NE): 54 mcg/24 h (ref 26–121)
Creatinine, Urine mg/day-CATEUR: 1.12 g/(24.h) (ref 0.50–2.15)
Dopamine 24 Hr Urine: 168 mcg/24 h (ref 52–480)
Norepinephrine, 24H, Ur: 54 mcg/24 h (ref 15–100)
Total Volume: 1150 mL

## 2020-12-30 LAB — CATECHOLAMINES, FRACTIONATED, PLASMA
Dopamine: 27 pg/mL — ABNORMAL HIGH
Epinephrine: <40 pg/mL
Norepinephrine: 1232 pg/mL — ABNORMAL HIGH
Total Catecholamines: 1259 pg/mL — ABNORMAL HIGH

## 2020-12-30 LAB — ALDOSTERONE + RENIN ACTIVITY W/ RATIO
ALDO / PRA Ratio: 4 Ratio (ref 0.9–28.9)
Aldosterone: 1 ng/dL
Renin Activity: 0.25 ng/mL/h (ref 0.25–5.82)

## 2020-12-30 LAB — METANEPHRINES, PLASMA
Metanephrine, Free: 35 pg/mL (ref <=57)
Normetanephrine, Free: 138 pg/mL (ref <=148)
Total Metanephrines-Plasma: 173 pg/mL (ref <=205)

## 2020-12-30 LAB — ACTH: C206 ACTH: 13 pg/mL (ref 6–50)

## 2021-01-05 ENCOUNTER — Other Ambulatory Visit: Payer: Self-pay

## 2021-01-05 DIAGNOSIS — I10 Essential (primary) hypertension: Secondary | ICD-10-CM

## 2021-01-05 MED ORDER — BIDIL 20-37.5 MG PO TABS: 2.0000 | ORAL_TABLET | Freq: Three times a day (TID) | ORAL | 2 refills | Status: DC

## 2021-01-11 ENCOUNTER — Encounter: Payer: Self-pay | Admitting: Cardiology

## 2021-01-11 ENCOUNTER — Ambulatory Visit: Payer: Medicare Other | Admitting: Cardiology

## 2021-01-11 ENCOUNTER — Other Ambulatory Visit: Payer: Self-pay

## 2021-01-11 VITALS — BP 128/76 | HR 55 | Temp 98.0°F | Resp 16 | Ht 67.0 in | Wt 174.0 lb

## 2021-01-11 DIAGNOSIS — I350 Nonrheumatic aortic (valve) stenosis: Secondary | ICD-10-CM

## 2021-01-11 DIAGNOSIS — I10 Essential (primary) hypertension: Secondary | ICD-10-CM

## 2021-01-11 DIAGNOSIS — E278 Other specified disorders of adrenal gland: Secondary | ICD-10-CM

## 2021-01-11 DIAGNOSIS — R932 Abnormal findings on diagnostic imaging of liver and biliary tract: Secondary | ICD-10-CM

## 2021-01-11 NOTE — Progress Notes (Signed)
Patient referred by [name],* for leg edema  Subjective:   Joseph Steele, male    DOB: 10-Aug-1951, 70 y.o.   MRN: [id number]   Chief Complaint  Patient presents with  . Hypertension  . Follow-up    2 month    70 y.o. Caucasian male with hypertension, type 2 DM, leg edema, h/o HCTZ induced hyponatremia.  Patient's blood pressure has improved, but still remains elevated. CTA abdomen/pelvis showed <50% renal artery atherosclerosis. 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 It also showed nodular liver surface s/o cirrhosis.   Patient is now following Dr. Loanne Drilling, endocrinology, with plans for further imaging and lab workup of adrenal nodule. Meanwhile, blood pressure is very well controlled.   Current Outpatient Medications on File Prior to Visit  Medication Sig Dispense Refill  . acetaminophen (TYLENOL) 500 MG tablet Take 500 mg by mouth every 6 (six) hours as needed for moderate pain.    Marland Kitchen amLODipine (NORVASC) 10 MG tablet Take 5 mg by mouth every evening.    Marland Kitchen aspirin EC 81 MG tablet Take 81 mg by mouth daily.    . carvedilol (COREG) 25 MG tablet Take 1.5 tablets (37.5 mg total) by mouth 2 (two) times daily with a meal. 180 tablet 3  . famotidine (PEPCID) 20 MG tablet Take 20 mg by mouth daily.    . fluticasone (FLONASE) 50 MCG/ACT nasal spray Place 2 sprays into both nostrils every morning.    . isosorbide-hydrALAZINE (BIDIL) 20-37.5 MG tablet Take 2 tablets by mouth 3 (three) times daily. 180 tablet 2  . metFORMIN (GLUCOPHAGE) 500 MG tablet Take 1,000 mg by mouth 2 (two) times daily with a meal. Per patient this is how he is taking it    . olmesartan (BENICAR) 40 MG tablet Take 1 tablet (40 mg total) by mouth every other day. 90 tablet 3  . simvastatin (ZOCOR) 40 MG tablet Take 20 mg by mouth every morning.    Marland Kitchen spironolactone (ALDACTONE) 50 MG tablet Take 1 tablet (50 mg total) by mouth daily. 30 tablet 3   . tamsulosin (FLOMAX) 0.4 MG CAPS capsule Take 0.4 mg by mouth daily.    Marland Kitchen triamcinolone cream (KENALOG) 0.1 % Apply 1 application topically daily as needed (eczema).     No current facility-administered medications on file prior to visit.    Cardiovascular studies:  CTA abdomen/pelvis 10/2020: 1. Mild atherosclerotic disease of the renal arteries resulting in less than 50% stenosis. 2. Indeterminate left adrenal nodule measuring approximately 2.4 cm. Follow-up with an adrenal protocol CT is recommended or comparison to an outside prior study to confirm stability. 3. There is a right-sided 3.5 cm adrenal myelolipoma. This likely explains the hyperechoic 3.7 cm lesion seen on the patient's prior ultrasound from 2019. 4. Nodular liver surface, suggestive of cirrhosis.  Aortic Atherosclerosis (ICD10-I70.0).  EKG 08/20/2020: Sinus rhythm 57 bpm Cannot exclude old inferior infarct  Echocardiogram 09/23/2019: Normal LV systolic function with EF 65%. Moderate concentric hypertrophy of the left ventricle. Left ventricle cavity is normal in size. Normal global wall motion. Doppler evidence of grade I (impaired) diastolic dysfunction, normal LAP. Calculated EF 65%. Left atrial cavity is mildly dilated. Trileaflet aortic valve with mild aortic valve leaflet calcification. No regurgitation noted. Trace aortic stenosis. Aortic valve mean gradient of 7 mmHg, Vmax of 2.0 m/s. Calculated aortic valve area by continuity equation is 2.9 cm. Trace tricuspid regurgitation. Estimated pulmonary artery systolic  pressure is 33 mmHg.  Renal artery duplex 03/26/2018: 1. No Doppler ultrasound evidence of hemodynamically significant renal artery stenosis. If there is continued clinical concern, renal MRA (lower radiation risk, can be performed noncontrast in the setting of renal dysfunction) and CTA ( higher spatial resolution) represent more accurate studies, which are additionally more sensitive to the  detection of duplicated renal arteries. 2. 3.7 cm echogenic right hepatic lesion, possibly benign hemangioma but incompletely characterized. Recommend liver protocol MRI or CT with contrast for possibly diagnostic characterization.  Recent labs: 09/03/2020: Chol 151, TG 207, HDL 43, LDL 74  06/05/2020: Glucose 125, BUN/Cr 13/1.0. EGFR 72. HbA1C 7.3% Chol 180, TG 332, HDL 36, LDL 69 TSH 1.5 normal   12/23/2019: Glucose 167, BUN/Cr 11/1.05. EGFR 72. Na/K 136/4.5.   10/24/2019: Glucose 240. BUN/Cr 9/1.01. eGFR 75. Na/K 139/4.0. Rest of the CMP normal.   06/14/2019: Glucose 121. BUN/Cr 10/1.07. eGFR 68. Na/K 130/3.7. Rest of the CMP normal.  H/H 12.7/37.2. MCV 82. Platelets 224 Chol 169, TG 188, HDL 66, LDL 65.  HbA1C 7.0%  Review of Systems  Cardiovascular: Negative for chest pain, dyspnea on exertion, leg swelling, palpitations and syncope.        Vitals:   01/11/21 1050  BP: 128/76  Pulse: (!) 55  Resp: 16  Temp: 98 F (36.7 C)  SpO2: 96%     Body mass index is 27.25 kg/m. Filed Weights   01/11/21 1050  Weight: 174 lb (78.9 kg)     Objective:   Physical Exam Vitals and nursing note reviewed.  Constitutional:      General: He is not in acute distress. Neck:     Vascular: No JVD.  Cardiovascular:     Rate and Rhythm: Normal rate and regular rhythm.     Pulses: Intact distal pulses.     Heart sounds: Murmur heard.   Harsh midsystolic murmur is present with a grade of 2/6 at the upper right sternal border radiating to the neck.   Pulmonary:     Effort: Pulmonary effort is normal.     Breath sounds: Normal breath sounds. No wheezing or rales.           Assessment & Recommendations:   70 y.o. Caucasian male with resistant hypertension, type 2 DM, leg edema, h/o HCTZ induced hyponatremia.  Resistant hypertension: Continue carvedilol to 37.5 mg bid, olmesartan 40 mg daily and amlodipine 10 mg daily, Bidil 20-37.5 mg 2 tab tid.  Adrenal  nodule: Ongoing work-up with endocrinologist Dr. Loanne Drilling.  Possibly cause for secondary and resistant hypertension.  Hypertriglyceridemia: Improved. Conitnue DM treatment  Mild aortic stenosis: Mild aortic stenosis, grade 1 diastolic dysfunction and mild pulmonary hypertension.   Asymptomatic. Repeat echocardiogram in 06/2021.  Abnormal CT liver: Findings s/o cirrhosis. No clinical h/o s/o cirrhosis. 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  In absence of definite indication, I have stopped his Aspirin  F/u in 6 months  Randall, MD [facility] Pager: [phone] Office: [phone] If no answer Cell [phone]

## 2021-02-18 ENCOUNTER — Ambulatory Visit (INDEPENDENT_AMBULATORY_CARE_PROVIDER_SITE_OTHER): Payer: Medicare Other | Admitting: Endocrinology

## 2021-02-18 ENCOUNTER — Other Ambulatory Visit: Payer: Self-pay

## 2021-02-18 VITALS — BP 120/60 | HR 57 | Ht 67.0 in | Wt 176.0 lb

## 2021-02-18 DIAGNOSIS — E278 Other specified disorders of adrenal gland: Secondary | ICD-10-CM

## 2021-02-18 LAB — CORTISOL: Cortisol, Plasma: 12.7 ug/dL

## 2021-02-18 NOTE — Progress Notes (Signed)
Subjective:    Patient ID: Joseph Steele, male    DOB: Apr 25, 1951, 70 y.o.   MRN: [id number]  HPI Pt returns for f/u of adrenal nodule (dx'ed 2022; he has long h/o HTN; PRA is low, and aldo is normal; other labs are normal; he was rx'ed aldactone).   Past Medical History:  Diagnosis Date  . Diabetes mellitus   . GERD (gastroesophageal reflux disease)   . Hyperlipidemia   . Hypertension     Past Surgical History:  Procedure Laterality Date  . COLONOSCOPY WITH PROPOFOL N/A 02/16/2016   Procedure: COLONOSCOPY WITH PROPOFOL;  Surgeon: Garlan Fair, MD;  Location: [facility];  Service: Endoscopy;  Laterality: N/A;  . INGUINAL HERNIA REPAIR     right at 70 years of age  . TONSILLECTOMY      Social History   Socioeconomic History  . Marital status: Married    Spouse name: Not on file  . Number of children: 2  . Years of education: Not on file  . Highest education level: Not on file  Occupational History  . Not on file  Tobacco Use  . Smoking status: Former Smoker    Packs/day: 2.00    Years: 10.00    Pack years: 20.00    Types: Cigarettes    Quit date: 1984    Years since quitting: 38.2  . Smokeless tobacco: Never Used  Vaping Use  . Vaping Use: Never used  Substance and Sexual Activity  . Alcohol use: Yes    Alcohol/week: 16.0 standard drinks    Types: 16 Standard drinks or equivalent per week    Comment: [id number]  . Drug use: No  . Sexual activity: Not on file  Other Topics Concern  . Not on file  Social History Narrative  . Not on file   Social Determinants of Health   Financial Resource Strain: Not on file  Food Insecurity: Not on file  Transportation Needs: Not on file  Physical Activity: Not on file  Stress: Not on file  Social Connections: Not on file  Intimate Partner Violence: Not on file    Current Outpatient Medications on File Prior to Visit  Medication Sig Dispense Refill  . acetaminophen (TYLENOL) 500 MG tablet Take 500 mg by mouth every  6 (six) hours as needed for moderate pain.    Marland Kitchen amLODipine (NORVASC) 10 MG tablet Take 5 mg by mouth every evening.    . carvedilol (COREG) 25 MG tablet Take 1.5 tablets (37.5 mg total) by mouth 2 (two) times daily with a meal. 180 tablet 3  . famotidine (PEPCID) 20 MG tablet Take 20 mg by mouth daily.    . fluticasone (FLONASE) 50 MCG/ACT nasal spray Place 2 sprays into both nostrils every morning.    . isosorbide-hydrALAZINE (BIDIL) 20-37.5 MG tablet Take 2 tablets by mouth 3 (three) times daily. 180 tablet 2  . metFORMIN (GLUCOPHAGE) 500 MG tablet Take 1,000 mg by mouth 2 (two) times daily with a meal. Per patient this is how he is taking it    . olmesartan (BENICAR) 40 MG tablet Take 1 tablet (40 mg total) by mouth every other day. 90 tablet 3  . simvastatin (ZOCOR) 40 MG tablet Take 20 mg by mouth every morning.    . tamsulosin (FLOMAX) 0.4 MG CAPS capsule Take 0.4 mg by mouth daily.    Marland Kitchen triamcinolone cream (KENALOG) 0.1 % Apply 1 application topically daily as needed (eczema).    Marland Kitchen spironolactone (ALDACTONE)  50 MG tablet Take 1 tablet (50 mg total) by mouth daily. 30 tablet 3   No current facility-administered medications on file prior to visit.    Allergies  Allergen Reactions  . Escitalopram Oxalate Other (See Comments)    Family History  Problem Relation Age of Onset  . Stroke Mother   . Hypertension Mother   . Hypertension Father   . Congestive Heart Failure Father   . Diabetes Maternal Grandmother   . Diabetes Maternal Grandfather   . Diabetes Paternal Grandmother   . Diabetes Paternal Grandfather     BP 120/60 (BP Location: Right Arm, Patient Position: Sitting, Cuff Size: Normal)   Pulse (!) 57   Ht 5\' 7"  (1.702 m)   Wt 176 lb (79.8 kg)   SpO2 95%   BMI 27.57 kg/m    Review of Systems     Objective:   Physical Exam VITAL SIGNS:  See vs page GENERAL: no distress EXT: no leg edema  Lab Results  Component Value Date   CREATININE 1.13 10/29/2020   BUN 12  10/29/2020   NA 138 10/29/2020   K 4.2 10/29/2020   CL 102 10/29/2020   CO2 22 10/29/2020       Assessment & Plan:  Adrenal nodule, due for recheck.  HTN: well-controlled: we discussed.  Please continue the same 5 BP meds.   Patient Instructions  Let's check the MRI.  you will receive a phone call, about a day and time for an appointment. Blood tests are requested for you today.  We'll let you know about the results.  If the MRI is unchanged, shows just the 1 nodule, and the blood is normal, no further testing is needed.   Please come back for a follow-up appointment in 2 months.

## 2021-02-18 NOTE — Patient Instructions (Addendum)
Let's check the MRI.  you will receive a phone call, about a day and time for an appointment. Blood tests are requested for you today.  We'll let you know about the results.  If the MRI is unchanged, shows just the 1 nodule, and the blood is normal, no further testing is needed.   Please come back for a follow-up appointment in 2 months.

## 2021-02-24 ENCOUNTER — Other Ambulatory Visit: Payer: Self-pay | Admitting: Internal Medicine

## 2021-02-24 DIAGNOSIS — N63 Unspecified lump in unspecified breast: Secondary | ICD-10-CM

## 2021-02-25 ENCOUNTER — Other Ambulatory Visit: Payer: Self-pay | Admitting: Cardiology

## 2021-02-25 DIAGNOSIS — I10 Essential (primary) hypertension: Secondary | ICD-10-CM

## 2021-02-28 LAB — CATECHOLAMINES, FRACTIONATED, PLASMA
Dopamine: <20 pg/mL
Epinephrine: <40 pg/mL
Norepinephrine: 1525 pg/mL — ABNORMAL HIGH
Total Catecholamines: 1525 pg/mL — ABNORMAL HIGH

## 2021-02-28 LAB — METANEPHRINES, PLASMA
Metanephrine, Free: 43 pg/mL (ref <=57)
Normetanephrine, Free: 163 pg/mL — ABNORMAL HIGH (ref <=148)
Total Metanephrines-Plasma: 206 pg/mL — ABNORMAL HIGH (ref <=205)

## 2021-02-28 LAB — ALDOSTERONE + RENIN ACTIVITY W/ RATIO
Aldosterone: <1 ng/dL
Renin Activity: 0.42 ng/mL/h (ref 0.25–5.82)

## 2021-03-06 ENCOUNTER — Other Ambulatory Visit: Payer: Self-pay

## 2021-03-06 ENCOUNTER — Ambulatory Visit
Admission: RE | Admit: 2021-03-06 | Discharge: 2021-03-06 | Disposition: A | Discharge status: Home / Self Care | Payer: Medicare Other | Source: Ambulatory Visit | Attending: Endocrinology | Admitting: Endocrinology

## 2021-03-06 DIAGNOSIS — E278 Other specified disorders of adrenal gland: Secondary | ICD-10-CM

## 2021-03-06 MED ORDER — GADOBENATE DIMEGLUMINE 529 MG/ML IV SOLN
16.0000 mL | Freq: Once | INTRAVENOUS | Status: AC | PRN
  Administered 2021-03-06: 16 mL via INTRAVENOUS

## 2021-03-15 ENCOUNTER — Other Ambulatory Visit: Payer: Medicare Other

## 2021-03-18 ENCOUNTER — Other Ambulatory Visit: Payer: Medicare Other

## 2021-03-19 ENCOUNTER — Telehealth: Payer: Self-pay | Admitting: Endocrinology

## 2021-03-19 ENCOUNTER — Other Ambulatory Visit: Payer: Medicare Other

## 2021-03-19 NOTE — Telephone Encounter (Signed)
Spoke with pt regarding MRI results

## 2021-03-19 NOTE — Telephone Encounter (Signed)
I assume he is asking about MRI.  Here is my chart message: Just the benign nodule--good. I'll see you next time.

## 2021-03-19 NOTE — Telephone Encounter (Signed)
Patient called to find out the results of the ultrasound from 03/06/21.  Please return call to [phone] - patient advises that if call goes directly to VM please try second number [phone].

## 2021-03-28 ENCOUNTER — Other Ambulatory Visit: Payer: Self-pay | Admitting: Cardiology

## 2021-03-28 DIAGNOSIS — I10 Essential (primary) hypertension: Secondary | ICD-10-CM

## 2021-03-30 ENCOUNTER — Ambulatory Visit: Payer: Medicare Other

## 2021-03-30 ENCOUNTER — Other Ambulatory Visit: Payer: Self-pay

## 2021-03-30 DIAGNOSIS — I350 Nonrheumatic aortic (valve) stenosis: Secondary | ICD-10-CM

## 2021-04-02 ENCOUNTER — Other Ambulatory Visit: Payer: Self-pay

## 2021-04-02 ENCOUNTER — Ambulatory Visit
Admission: RE | Admit: 2021-04-02 | Discharge: 2021-04-02 | Disposition: A | Discharge status: Home / Self Care | Payer: Medicare Other | Source: Ambulatory Visit | Attending: Internal Medicine | Admitting: Internal Medicine

## 2021-04-02 ENCOUNTER — Ambulatory Visit: Payer: Medicare Other

## 2021-04-02 DIAGNOSIS — N63 Unspecified lump in unspecified breast: Secondary | ICD-10-CM

## 2021-04-09 ENCOUNTER — Ambulatory Visit: Payer: Medicare Other | Admitting: Cardiology

## 2021-04-29 ENCOUNTER — Other Ambulatory Visit: Payer: Self-pay | Admitting: Cardiology

## 2021-04-29 DIAGNOSIS — I10 Essential (primary) hypertension: Secondary | ICD-10-CM

## 2021-05-05 ENCOUNTER — Other Ambulatory Visit: Payer: Self-pay

## 2021-05-05 ENCOUNTER — Encounter: Payer: Self-pay | Admitting: Endocrinology

## 2021-05-05 ENCOUNTER — Ambulatory Visit (INDEPENDENT_AMBULATORY_CARE_PROVIDER_SITE_OTHER): Payer: Medicare Other | Admitting: Endocrinology

## 2021-05-05 DIAGNOSIS — I10 Essential (primary) hypertension: Secondary | ICD-10-CM | POA: Diagnosis not present

## 2021-05-05 MED ORDER — SPIRONOLACTONE 50 MG PO TABS: 50.0000 mg | ORAL_TABLET | Freq: Every day | ORAL | 3 refills | Status: DC

## 2021-05-05 NOTE — Progress Notes (Signed)
Subjective:    Patient ID: Joseph Steele, male    DOB: [birth year], 70 y.o.   MRN: [id number]  HPI Pt returns for f/u of adrenal nodule (dx'ed 2022; he has long h/o HTN; PRA is low, and aldo is normal; other adrenal labs are normal; he was rx'ed aldactone; MRI (2022) showed stable adenoma).  pt states he feels well in general.  Past Medical History:  Diagnosis Date   Diabetes mellitus    GERD (gastroesophageal reflux disease)    Hyperlipidemia    Hypertension     Past Surgical History:  Procedure Laterality Date   COLONOSCOPY WITH PROPOFOL N/A 02/16/2016   Procedure: COLONOSCOPY WITH PROPOFOL;  Surgeon: Garlan Fair, MD;  Location: [facility];  Service: Endoscopy;  Laterality: N/A;   INGUINAL HERNIA REPAIR     right at 70 years of age   TONSILLECTOMY      Social History   Socioeconomic History   Marital status: Married    Spouse name: Not on file   Number of children: 2   Years of education: Not on file   Highest education level: Not on file  Occupational History   Not on file  Tobacco Use   Smoking status: Former    Packs/day: 2.00    Years: 10.00    Pack years: 20.00    Types: Cigarettes    Quit date: 64    Years since quitting: 38.5   Smokeless tobacco: Never  Vaping Use   Vaping Use: Never used  Substance and Sexual Activity   Alcohol use: Yes    Alcohol/week: 16.0 standard drinks    Types: 16 Standard drinks or equivalent per week    Comment: [id number]   Drug use: No   Sexual activity: Not on file  Other Topics Concern   Not on file  Social History Narrative   Not on file   Social Determinants of Health   Financial Resource Strain: Not on file  Food Insecurity: Not on file  Transportation Needs: Not on file  Physical Activity: Not on file  Stress: Not on file  Social Connections: Not on file  Intimate Partner Violence: Not on file    Current Outpatient Medications on File Prior to Visit  Medication Sig Dispense Refill   acetaminophen  (TYLENOL) 500 MG tablet Take 500 mg by mouth every 6 (six) hours as needed for moderate pain.     amLODipine (NORVASC) 10 MG tablet Take 5 mg by mouth every evening.     carvedilol (COREG) 25 MG tablet Take 1.5 tablets (37.5 mg total) by mouth 2 (two) times daily with a meal. 180 tablet 3   famotidine (PEPCID) 20 MG tablet Take 20 mg by mouth daily.     fluticasone (FLONASE) 50 MCG/ACT nasal spray Place 2 sprays into both nostrils every morning.     isosorbide-hydrALAZINE (BIDIL) 20-37.5 MG tablet Take 2 tablets by mouth 3 (three) times daily. 180 tablet 2   metFORMIN (GLUCOPHAGE) 500 MG tablet Take 1,000 mg by mouth 2 (two) times daily with a meal. Per patient this is how he is taking it     olmesartan (BENICAR) 40 MG tablet Take 1 tablet (40 mg total) by mouth every other day. 90 tablet 3   simvastatin (ZOCOR) 40 MG tablet Take 20 mg by mouth every morning.     tamsulosin (FLOMAX) 0.4 MG CAPS capsule Take 0.4 mg by mouth daily.     triamcinolone cream (KENALOG) 0.1 % Apply 1 application  topically daily as needed (eczema).     No current facility-administered medications on file prior to visit.    Allergies  Allergen Reactions   Escitalopram Oxalate Other (See Comments)    Family History  Problem Relation Age of Onset   Stroke Mother    Hypertension Mother    Hypertension Father    Congestive Heart Failure Father    Diabetes Maternal Grandmother    Diabetes Maternal Grandfather    Diabetes Paternal Grandmother    Diabetes Paternal Grandfather     BP 118/68 (BP Location: Left Arm, Patient Position: Sitting, Cuff Size: Normal)   Pulse (!) 57   Ht 5\' 7"  (1.702 m)   Wt 170 lb 4 oz (77.2 kg)   SpO2 97%   BMI 26.66 kg/m    Review of Systems Denies sob.      Objective:   Physical Exam VITAL SIGNS:  See vs page GENERAL: no distress.  EXT: no leg edema.    Lab Results  Component Value Date   CREATININE 1.13 10/29/2020   BUN 12 10/29/2020   NA 138 10/29/2020   K 4.2  10/29/2020   CL 102 10/29/2020   CO2 22 10/29/2020       Assessment & Plan:  Adrenal nodule, stable.  No further w/u is needed. HTN: well-controlled  Patient Instructions  Please continue the same spironolactone.   No further adrenal testing is needed, except for your annual blood work.   I would be happy to see you back here as needed.

## 2021-05-05 NOTE — Patient Instructions (Addendum)
Please continue the same spironolactone.   No further adrenal testing is needed, except for your annual blood work.   I would be happy to see you back here as needed.

## 2021-05-20 ENCOUNTER — Other Ambulatory Visit: Payer: Self-pay | Admitting: Cardiology

## 2021-05-20 DIAGNOSIS — I10 Essential (primary) hypertension: Secondary | ICD-10-CM

## 2021-06-21 ENCOUNTER — Other Ambulatory Visit: Payer: Medicare Other

## 2021-07-02 ENCOUNTER — Ambulatory Visit: Payer: Medicare Other | Admitting: Cardiology

## 2021-07-08 ENCOUNTER — Encounter: Payer: Self-pay | Admitting: Cardiology

## 2021-07-08 ENCOUNTER — Ambulatory Visit: Payer: Medicare Other | Admitting: Cardiology

## 2021-07-08 ENCOUNTER — Other Ambulatory Visit: Payer: Self-pay

## 2021-07-08 VITALS — BP 150/73 | HR 53 | Temp 97.5°F | Resp 16 | Ht 67.0 in | Wt 176.6 lb

## 2021-07-08 DIAGNOSIS — D3502 Benign neoplasm of left adrenal gland: Secondary | ICD-10-CM

## 2021-07-08 DIAGNOSIS — I1A0 Resistant hypertension: Secondary | ICD-10-CM

## 2021-07-08 DIAGNOSIS — I10 Essential (primary) hypertension: Secondary | ICD-10-CM

## 2021-07-08 MED ORDER — AMLODIPINE BESYLATE 5 MG PO TABS: 7.5000 mg | ORAL_TABLET | Freq: Every day | ORAL | 3 refills | Status: DC

## 2021-07-08 NOTE — Progress Notes (Signed)
Patient referred by [name],* for leg edema  Subjective:   Joseph Steele, male    DOB: 12-22-1950, 70 y.o.   MRN: [id number]   Chief Complaint  Patient presents with   Mild aortic stenosis   Hypertension   Follow-up    1 year    70 y.o. Caucasian male with hypertension, type 2 DM, leg edema, h/o HCTZ induced hyponatremia, benign adrenal adenoma  Patient is doing well, denies chest pain, shortness of breath, palpitations, leg edema, orthopnea, PND, TIA/syncope. Blood pressure is elevated today.   He was seen by Dr. Loanne Drilling. He underwent MRI abdomen, which showed benign adrenal adenoma.   Reviewed recent echocardiogram results with the patient, details below.   Current Outpatient Medications on File Prior to Visit  Medication Sig Dispense Refill   acetaminophen (TYLENOL) 500 MG tablet Take 500 mg by mouth every 6 (six) hours as needed for moderate pain.     amLODipine (NORVASC) 10 MG tablet Take 5 mg by mouth every evening.     carvedilol (COREG) 25 MG tablet TAKE 1 AND 1/2 TABLETS BY  MOUTH TWICE DAILY WITH A  MEAL 180 tablet 3   famotidine (PEPCID) 20 MG tablet Take 20 mg by mouth daily.     fluticasone (FLONASE) 50 MCG/ACT nasal spray Place 2 sprays into both nostrils every morning.     isosorbide-hydrALAZINE (BIDIL) 20-37.5 MG tablet Take 2 tablets by mouth 3 (three) times daily. 180 tablet 2   metFORMIN (GLUCOPHAGE) 500 MG tablet Take 1,000 mg by mouth 2 (two) times daily with a meal. Per patient this is how he is taking it     olmesartan (BENICAR) 40 MG tablet Take 1 tablet (40 mg total) by mouth every other day. 90 tablet 3   simvastatin (ZOCOR) 40 MG tablet Take 20 mg by mouth every morning.     spironolactone (ALDACTONE) 50 MG tablet Take 1 tablet (50 mg total) by mouth daily. 90 tablet 3   tamsulosin (FLOMAX) 0.4 MG CAPS capsule Take 0.4 mg by mouth daily.     triamcinolone cream (KENALOG) 0.1 % Apply 1 application topically daily as needed (eczema).      No current facility-administered medications on file prior to visit.    Cardiovascular studies:  EKG 07/08/2021: Sinus bradycardia 47 bpm  Low voltage in limb leads  Echocardiogram 03/30/2021:  Left ventricle cavity is normal in size. Moderate concentric hypertrophy  of the left ventricle. Normal global wall motion. Normal LV systolic  function with visual EF 55-60%. Doppler evidence of grade I (impaired)  diastolic dysfunction, normal LAP. Left atrial cavity is severely dilated.  Aneurysmal interatrial septum without 2D or color Doppler evidence of  shunting.  Structurally normal trileaflet aortic valve with no regurgitation.  Increased transvalvular velocity and gradient (Max 17 mmHg) likely arising  through LVOT.  Mild (Grade I) mitral regurgitation.  Mild tricuspid regurgitation. Estimated pulmonary artery systolic pressure  29 mmHg.  Compared to previous study on 09/23/2019, LA appears more dilated.   MRI abdomen 02/2021: Benign left adrenal adenoma. Benign right adrenal myelolipoma.  CTA abdomen/pelvis 10/2020: 1. Mild atherosclerotic disease of the renal arteries resulting in less than 50% stenosis. 2. Indeterminate left adrenal nodule measuring approximately 2.4 cm. Follow-up with an adrenal protocol CT is recommended or comparison to an outside prior study to confirm stability. 3. There is a right-sided 3.5 cm adrenal myelolipoma. This likely explains the hyperechoic 3.7 cm lesion seen on the patient's prior ultrasound from 2019.  4. Nodular liver surface, suggestive of cirrhosis.   Aortic Atherosclerosis (ICD10-I70.0).  EKG 08/20/2020: Sinus rhythm 57 bpm Cannot exclude old inferior infarct  Echocardiogram 09/23/2019: Normal LV systolic function with EF 65%. Moderate concentric hypertrophy of the left ventricle. Left ventricle cavity is normal in size. Normal global wall motion. Doppler evidence of grade I (impaired) diastolic dysfunction, normal LAP. Calculated  EF 65%. Left atrial cavity is mildly dilated. Trileaflet aortic valve with mild aortic valve leaflet calcification. No regurgitation noted. Trace aortic stenosis. Aortic valve mean gradient of 7 mmHg, Vmax of 2.0 m/s. Calculated aortic valve area by continuity equation is 2.9 cm. Trace tricuspid regurgitation. Estimated pulmonary artery systolic pressure is 33 mmHg.  Renal artery duplex 03/26/2018: 1. No Doppler ultrasound evidence of hemodynamically significant renal artery stenosis. If there is continued clinical concern, renal MRA (lower radiation risk, can be performed noncontrast in the setting of renal dysfunction) and CTA ( higher spatial resolution) represent more accurate studies, which are additionally more sensitive to the detection of duplicated renal arteries. 2. 3.7 cm echogenic right hepatic lesion, possibly benign hemangioma but incompletely characterized. Recommend liver protocol MRI or CT with contrast for possibly diagnostic characterization.  Recent labs: 09/03/2020: Chol 151, TG 207, HDL 43, LDL 74  06/05/2020: Glucose 125, BUN/Cr 13/1.0. EGFR 72. HbA1C 7.3% Chol 180, TG 332, HDL 36, LDL 69 TSH 1.5 normal   12/23/2019: Glucose 167, BUN/Cr 11/1.05. EGFR 72. Na/K 136/4.5.   10/24/2019: Glucose 240. BUN/Cr 9/1.01. eGFR 75. Na/K 139/4.0. Rest of the CMP normal.   06/14/2019: Glucose 121. BUN/Cr 10/1.07. eGFR 68. Na/K 130/3.7. Rest of the CMP normal.  H/H 12.7/37.2. MCV 82. Platelets 224 Chol 169, TG 188, HDL 66, LDL 65.  HbA1C 7.0%  Review of Systems  Cardiovascular:  Negative for chest pain, dyspnea on exertion, leg swelling, palpitations and syncope.       Vitals:   07/08/21 1338  BP: (!) 150/73  Pulse: (!) 53  Resp: 16  Temp: (!) 97.5 F (36.4 C)  SpO2: 97%     Body mass index is 27.66 kg/m. Filed Weights   07/08/21 1338  Weight: 176 lb 9.6 oz (80.1 kg)     Objective:   Physical Exam Vitals and nursing note reviewed.  Constitutional:       General: He is not in acute distress. Neck:     Vascular: No JVD.  Cardiovascular:     Rate and Rhythm: Normal rate and regular rhythm.     Pulses: Intact distal pulses.     Heart sounds: Murmur heard.  Harsh midsystolic murmur is present with a grade of 2/6 at the upper right sternal border radiating to the neck.  Pulmonary:     Effort: Pulmonary effort is normal.     Breath sounds: Normal breath sounds. No wheezing or rales.  Musculoskeletal:     Right lower leg: No edema.     Left lower leg: No edema.          Assessment & Recommendations:   70 y.o. Caucasian male with resistant hypertension, type 2 DM, leg edema, h/o HCTZ induced hyponatremia.  Resistant hypertension: Continue carvedilol to 37.5 mg bid, olmesartan 40 mg daily, spironolactone 50 mg daily, Bidil 20-37.5 mg tid. Increased amlodipine to 7.5 mg daily. I will defer to endocrinology if adrenal adenoma could be a cause of secondary hypertension and if any surgical correction would be necessary.  Mild LVOT gradient likely due to mod LVH, rather than true aortic stenosis. 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  Hypertriglyceridemia: Improved. Conitnue DM treatment  F/u in 6 months  Linus Weckerly Esther Hardy, MD [facility] Pager: [phone] Office: [phone] If no answer Cell [phone]

## 2022-01-10 ENCOUNTER — Other Ambulatory Visit: Payer: Self-pay

## 2022-01-10 ENCOUNTER — Ambulatory Visit: Payer: Medicare Other | Admitting: Cardiology

## 2022-01-10 ENCOUNTER — Encounter: Payer: Self-pay | Admitting: Cardiology

## 2022-01-10 VITALS — BP 125/72 | HR 53 | Temp 98.0°F | Ht 67.0 in | Wt 178.0 lb

## 2022-01-10 DIAGNOSIS — E781 Pure hyperglyceridemia: Secondary | ICD-10-CM

## 2022-01-10 DIAGNOSIS — I10 Essential (primary) hypertension: Secondary | ICD-10-CM

## 2022-01-10 NOTE — Progress Notes (Signed)
Patient referred by [name],* for leg edema  Subjective:   Joseph Steele, male    DOB: 01/20/1951, 71 y.o.   MRN: [id number]   Chief Complaint  Patient presents with   Hypertension    71 y.o. Caucasian male with hypertension, type 2 DM, leg edema, h/o HCTZ induced hyponatremia, benign adrenal adenoma  Patient is doing well.  He has no complaints today.  Blood pressures well controlled.  Reviewed external labs, details below.  Current Outpatient Medications on File Prior to Visit  Medication Sig Dispense Refill   acetaminophen (TYLENOL) 500 MG tablet Take 500 mg by mouth every 6 (six) hours as needed for moderate pain.     amLODipine (NORVASC) 5 MG tablet Take 1.5 tablets (7.5 mg total) by mouth daily. 145 tablet 3   carvedilol (COREG) 25 MG tablet TAKE 1 AND 1/2 TABLETS BY  MOUTH TWICE DAILY WITH A  MEAL 180 tablet 3   famotidine (PEPCID) 20 MG tablet Take 20 mg by mouth daily.     fluticasone (FLONASE) 50 MCG/ACT nasal spray Place 2 sprays into both nostrils every morning.     isosorbide-hydrALAZINE (BIDIL) 20-37.5 MG tablet Take 2 tablets by mouth 3 (three) times daily. (Patient taking differently: Take 1 tablet by mouth 3 (three) times daily.) 180 tablet 2   metFORMIN (GLUCOPHAGE) 500 MG tablet Take 500 mg by mouth 2 (two) times daily with a meal. Per patient this is how he is taking it     olmesartan (BENICAR) 40 MG tablet Take 1 tablet (40 mg total) by mouth every other day. 90 tablet 3   simvastatin (ZOCOR) 20 MG tablet Take 20 mg by mouth every morning.     spironolactone (ALDACTONE) 50 MG tablet Take 1 tablet (50 mg total) by mouth daily. 90 tablet 3   tamsulosin (FLOMAX) 0.4 MG CAPS capsule Take 0.4 mg by mouth daily.     triamcinolone cream (KENALOG) 0.1 % Apply 1 application topically daily as needed (eczema).     No current facility-administered medications on file prior to visit.    Cardiovascular studies:  EKG 01/10/2022: Sinus rhythm 51  bpm   Echocardiogram 03/30/2021:  Left ventricle cavity is normal in size. Moderate concentric hypertrophy  of the left ventricle. Normal global wall motion. Normal LV systolic  function with visual EF 55-60%. Doppler evidence of grade I (impaired)  diastolic dysfunction, normal LAP. Left atrial cavity is severely dilated.  Aneurysmal interatrial septum without 2D or color Doppler evidence of  shunting.  Structurally normal trileaflet aortic valve with no regurgitation.  Increased transvalvular velocity and gradient (Max 17 mmHg) likely arising  through LVOT.  Mild (Grade I) mitral regurgitation.  Mild tricuspid regurgitation. Estimated pulmonary artery systolic pressure  29 mmHg.  Compared to previous study on 09/23/2019, LA appears more dilated.   MRI abdomen 02/2021: Benign left adrenal adenoma. Benign right adrenal myelolipoma.  CTA abdomen/pelvis 10/2020: 1. Mild atherosclerotic disease of the renal arteries resulting in less than 50% stenosis. 2. Indeterminate left adrenal nodule measuring approximately 2.4 cm. Follow-up with an adrenal protocol CT is recommended or comparison to an outside prior study to confirm stability. 3. There is a right-sided 3.5 cm adrenal myelolipoma. This likely explains the hyperechoic 3.7 cm lesion seen on the patient's prior ultrasound from 2019. 4. Nodular liver surface, suggestive of cirrhosis.   Aortic Atherosclerosis (ICD10-I70.0).  EKG 08/20/2020: Sinus rhythm 57 bpm Cannot exclude old inferior infarct  Renal artery duplex 03/26/2018: 1. No Doppler  ultrasound evidence of hemodynamically significant renal artery stenosis. If there is continued clinical concern, renal MRA (lower radiation risk, can be performed noncontrast in the setting of renal dysfunction) and CTA ( higher spatial resolution) represent more accurate studies, which are additionally more sensitive to the detection of duplicated renal arteries. 2. 3.7 cm echogenic right  hepatic lesion, possibly benign hemangioma but incompletely characterized. Recommend liver protocol MRI or CT with contrast for possibly diagnostic characterization.  Recent labs: 11/04/2021: Glucose 125, BUN/Cr 15/1.14. EGFR 68. Na/K 138/4.6. Rest of the CMP normal HbA1C 7.3% Chol 130, TG 189, HDL 36, LDL 63  09/03/2020: Chol 151, TG 207, HDL 43, LDL 74  06/05/2020: Glucose 125, BUN/Cr 13/1.0. EGFR 72. H/H 14/39. MCV 96. Platelets 182 HbA1C 7.3% Chol 180, TG 332, HDL 36, LDL 69 TSH 1.5 normal   12/23/2019: Glucose 167, BUN/Cr 11/1.05. EGFR 72. Na/K 136/4.5.   10/24/2019: Glucose 240. BUN/Cr 9/1.01. eGFR 75. Na/K 139/4.0. Rest of the CMP normal.   06/14/2019: Glucose 121. BUN/Cr 10/1.07. eGFR 68. Na/K 130/3.7. Rest of the CMP normal.  H/H 12.7/37.2. MCV 82. Platelets 224 Chol 169, TG 188, HDL 66, LDL 65.  HbA1C 7.0%  Review of Systems  Cardiovascular:  Negative for chest pain, dyspnea on exertion, leg swelling, palpitations and syncope.       Vitals:   01/10/22 1249  BP: 125/72  Pulse: (!) 53  Temp: 98 F (36.7 C)  SpO2: 98%     Body mass index is 27.88 kg/m. Filed Weights   01/10/22 1249  Weight: 178 lb (80.7 kg)     Objective:   Physical Exam Vitals and nursing note reviewed.  Constitutional:      General: He is not in acute distress. Neck:     Vascular: No JVD.  Cardiovascular:     Rate and Rhythm: Normal rate and regular rhythm.     Pulses: Intact distal pulses.     Heart sounds: Murmur heard.  Harsh midsystolic murmur is present with a grade of 2/6 at the upper right sternal border radiating to the neck.  Pulmonary:     Effort: Pulmonary effort is normal.     Breath sounds: Normal breath sounds. No wheezing or rales.  Musculoskeletal:     Right lower leg: No edema.     Left lower leg: No edema.       ICD-10-CM   1. Resistant hypertension  I10 EKG 12-Lead    PCV ECHOCARDIOGRAM COMPLETE    2. Hypertriglyceridemia  E78.1      Orders  Placed This Encounter  Procedures   EKG 12-Lead   PCV ECHOCARDIOGRAM COMPLETE   Medications Discontinued During This Encounter  Medication Reason   acetaminophen (TYLENOL) 500 MG tablet    isosorbide-hydrALAZINE (BIDIL) 20-37.5 MG tablet Discontinued by provider        Assessment & Recommendations:   71 y.o. Caucasian male with resistant hypertension, type 2 DM, leg edema, h/o HCTZ induced hyponatremia.  Resistant hypertension: Continue carvedilol to 37.5 mg bid, olmesartan 40 mg daily, spironolactone 50 mg daily, amlodipine 5 mg daily.  He is not currently taking BiDil 3 times a day, only takes it once a night.  He would like to come off it.  This is okay with me.  In future, if needed, we will can place him back on it for blood pressure control..  Mild LVOT gradient likely due to mod LVH, rather than true aortic stenosis. 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 We will repeat echocardiogram in  6 months  Hypertriglyceridemia: Improved. Conitnue DM treatment  F/u in 6 months  Dushaun Okey Esther Hardy, MD [facility] Pager: [phone] Office: [phone] If no answer Cell [phone]

## 2022-06-27 ENCOUNTER — Other Ambulatory Visit: Payer: Medicare Other

## 2022-06-28 ENCOUNTER — Ambulatory Visit: Payer: Medicare Other

## 2022-06-28 DIAGNOSIS — I10 Essential (primary) hypertension: Secondary | ICD-10-CM

## 2022-07-06 ENCOUNTER — Ambulatory Visit: Payer: Medicare Other | Admitting: Cardiology

## 2022-07-06 ENCOUNTER — Encounter: Payer: Self-pay | Admitting: Cardiology

## 2022-07-06 VITALS — BP 141/69 | HR 59 | Temp 98.0°F | Resp 16 | Ht 67.0 in | Wt 188.0 lb

## 2022-07-06 DIAGNOSIS — E875 Hyperkalemia: Secondary | ICD-10-CM | POA: Insufficient documentation

## 2022-07-06 DIAGNOSIS — E781 Pure hyperglyceridemia: Secondary | ICD-10-CM

## 2022-07-06 DIAGNOSIS — I1A0 Resistant hypertension: Secondary | ICD-10-CM

## 2022-07-06 DIAGNOSIS — I10 Essential (primary) hypertension: Secondary | ICD-10-CM

## 2022-07-06 MED ORDER — ROSUVASTATIN CALCIUM 20 MG PO TABS: 20.0000 mg | ORAL_TABLET | Freq: Every day | ORAL | 3 refills | Status: DC

## 2022-07-06 NOTE — Progress Notes (Signed)
  Patient referred by Varadarajan, Rupashree,* for leg edema  Subjective:   Joseph Steele, male    DOB: 09/16/1951, 70 y.o.   MRN: [id number]   Chief Complaint  Patient presents with   Hypertension   Follow-up    6 month    Results    echo    70 y.o. Caucasian male with hypertension, type 2 DM, leg edema, h/o HCTZ induced hyponatremia, benign adrenal adenoma  Patient is doing well. Blood pressure is fairly well controlled.  Reviewed recent test results with the patient, details below.    Current Outpatient Medications:    amLODipine (NORVASC) 5 MG tablet, Take 1.5 tablets (7.5 mg total) by mouth daily. (Patient taking differently: Take 5 mg by mouth daily.), Disp: 145 tablet, Rfl: 3   carvedilol (COREG) 25 MG tablet, TAKE 1 AND 1/2 TABLETS BY  MOUTH TWICE DAILY WITH A  MEAL, Disp: 180 tablet, Rfl: 3   fluticasone (FLONASE) 50 MCG/ACT nasal spray, Place 2 sprays into both nostrils every morning., Disp: , Rfl:    metFORMIN (GLUCOPHAGE) 500 MG tablet, Take 500 mg by mouth 2 (two) times daily with a meal. Per patient this is how he is taking it, Disp: , Rfl:    olmesartan (BENICAR) 40 MG tablet, Take 1 tablet (40 mg total) by mouth every other day., Disp: 90 tablet, Rfl: 3   simvastatin (ZOCOR) 20 MG tablet, Take 20 mg by mouth every morning., Disp: , Rfl:    spironolactone (ALDACTONE) 50 MG tablet, Take 1 tablet (50 mg total) by mouth daily., Disp: 90 tablet, Rfl: 3   tamsulosin (FLOMAX) 0.4 MG CAPS capsule, Take 0.4 mg by mouth daily., Disp: , Rfl:    triamcinolone cream (KENALOG) 0.1 %, Apply 1 application topically daily as needed (eczema)., Disp: , Rfl:   Cardiovascular studies:  EKG 07/06/2022: Sinus rhythm 58 bpm Possible old anteroseptal infarct Poor R wave progression  Echocardiogram 06/29/2022:  Normal LV systolic function with visual EF 60-65%. Left ventricle cavity  is normal in size. Mild concentric hypertrophy of the left ventricle.  Normal global wall motion.  Doppler evidence of grade I (impaired)  diastolic dysfunction, elevated LAP. Calculated EF 66%.  Left atrial cavity is mildly dilated at 4.2 cm.  Trileaflet aortic valve with no regurgitation. Mild aortic valve leaflet  calcification.  Structurally normal tricuspid valve with trace regurgitation. No evidence  of pulmonary hypertension.   MRI abdomen 02/2021: Benign left adrenal adenoma. Benign right adrenal myelolipoma.  Renal artery duplex 03/26/2018: 1. No Doppler ultrasound evidence of hemodynamically significant renal artery stenosis. If there is continued clinical concern, renal MRA (lower radiation risk, can be performed noncontrast in the setting of renal dysfunction) and CTA ( higher spatial resolution) represent more accurate studies, which are additionally more sensitive to the detection of duplicated renal arteries. 2. 3.7 cm echogenic right hepatic lesion, possibly benign hemangioma but incompletely characterized. Recommend liver protocol MRI or CT with contrast for possibly diagnostic characterization.  Recent labs: 06/27/2022: Glucose 141, BUN/Cr 24/1.31. EGFR 58. Na/K 138/5.8. Rest of the CMP normal H/H 12/35. MCV 91. Platelets 185 HbA1C 7.3% Chol 159, TG 344, HDL 33, LDL 71 TSH NA  11/04/2021: Glucose 125, BUN/Cr 15/1.14. EGFR 68. Na/K 138/4.6. Rest of the CMP normal HbA1C 7.3% Chol 130, TG 189, HDL 36, LDL 63  Review of Systems  Cardiovascular:  Negative for chest pain, dyspnea on exertion, leg swelling, palpitations and syncope.        Vitals:     07/06/22 1130  BP: (!) 141/69  Pulse: (!) 59  Resp: 16  Temp: 98 F (36.7 C)  SpO2: 96%     Body mass index is 29.44 kg/m. Filed Weights   07/06/22 1130  Weight: 188 lb (85.3 kg)     Objective:   Physical Exam Vitals and nursing note reviewed.  Constitutional:      General: He is not in acute distress. Neck:     Vascular: No JVD.  Cardiovascular:     Rate and Rhythm: Normal rate and regular  rhythm.     Pulses: Intact distal pulses.     Heart sounds: Murmur heard.     Harsh midsystolic murmur is present with a grade of 2/6 at the upper right sternal border radiating to the neck.  Pulmonary:     Effort: Pulmonary effort is normal.     Breath sounds: Normal breath sounds. No wheezing or rales.  Musculoskeletal:     Right lower leg: No edema.     Left lower leg: No edema.        ICD-10-CM   1. Resistant hypertension  I10 EKG 12-Lead     Orders Placed This Encounter  Procedures   EKG 12-Lead   Medications Discontinued During This Encounter  Medication Reason   famotidine (PEPCID) 20 MG tablet         Assessment & Recommendations:   70 y.o. Caucasian male with resistant hypertension, type 2 DM, leg edema, h/o HCTZ induced hyponatremia.  Hypertension: Well controlled, with numbers even lower at home. K 5.8. Therefore, stopped spironolactone. EKG with no hyperkalemia changes. He has upcoming labs through PCP in 2 weeks. Patient will contact me if BP is elevated, could increase amlodipine. Continue carvedilol to 12.5 mg bid, olmesartan 40 mg daily, amlodipine 5 mg daily.   Hypertriglyceridemia: TG elevated along with A1C. Change simvastatin to Crestor. Continue diabetes management as per PCP. Repeat fasting lipid panel and check direct LDL in 3 months  F/u in 3 months  Manish J Patwardhan, MD [facility] Pager: [phone] Office: [phone] If no answer Cell [phone]    

## 2022-08-22 ENCOUNTER — Other Ambulatory Visit: Payer: Self-pay

## 2022-08-22 DIAGNOSIS — I1A0 Resistant hypertension: Secondary | ICD-10-CM

## 2022-08-22 MED ORDER — AMLODIPINE BESYLATE 5 MG PO TABS: 5.0000 mg | ORAL_TABLET | Freq: Every day | ORAL | 3 refills | Status: DC

## 2022-10-12 ENCOUNTER — Encounter: Payer: Self-pay | Admitting: Cardiology

## 2022-10-12 ENCOUNTER — Ambulatory Visit: Payer: Medicare Other | Admitting: Cardiology

## 2022-10-12 VITALS — BP 149/78 | HR 55 | Resp 16 | Ht 67.0 in | Wt 185.0 lb

## 2022-10-12 DIAGNOSIS — E781 Pure hyperglyceridemia: Secondary | ICD-10-CM

## 2022-10-12 DIAGNOSIS — I1A0 Resistant hypertension: Secondary | ICD-10-CM

## 2022-10-12 DIAGNOSIS — I10 Essential (primary) hypertension: Secondary | ICD-10-CM

## 2022-10-12 MED ORDER — AMLODIPINE BESYLATE 10 MG PO TABS: 10.0000 mg | ORAL_TABLET | Freq: Every day | ORAL | 3 refills | Status: DC

## 2022-10-12 MED ORDER — CARVEDILOL 25 MG PO TABS: 25.0000 mg | ORAL_TABLET | Freq: Two times a day (BID) | ORAL | 3 refills | Status: DC

## 2022-10-12 NOTE — Progress Notes (Signed)
Patient referred by [name],* for leg edema  Subjective:   Joseph Steele, male    DOB: 12-27-1950, 71 y.o.   MRN: [id number]   Chief Complaint  Patient presents with   Hypertension   Hyperlipidemia   Follow-up    3 month    71 y.o. Caucasian male with hypertension, type 2 DM, leg edema, h/o HCTZ induced hyponatremia, benign adrenal adenoma  Blood pressure has been elevated after stopping spironolactone due to hyperkalemia. No other complaints.    Current Outpatient Medications:    amLODipine (NORVASC) 5 MG tablet, Take 1 tablet (5 mg total) by mouth daily., Disp: 90 tablet, Rfl: 3   carvedilol (COREG) 25 MG tablet, TAKE 1 AND 1/2 TABLETS BY  MOUTH TWICE DAILY WITH A  MEAL (Patient taking differently: Take 12.5 mg by mouth 2 (two) times daily with a meal.), Disp: 180 tablet, Rfl: 3   famotidine (PEPCID) 20 MG tablet, Take 20 mg by mouth 2 (two) times daily., Disp: , Rfl:    fluticasone (FLONASE) 50 MCG/ACT nasal spray, Place 2 sprays into both nostrils every morning., Disp: , Rfl:    metFORMIN (GLUCOPHAGE) 500 MG tablet, Take 500 mg by mouth 2 (two) times daily with a meal. Per patient this is how he is taking it, Disp: , Rfl:    olmesartan (BENICAR) 40 MG tablet, Take 1 tablet (40 mg total) by mouth every other day., Disp: 90 tablet, Rfl: 3   rosuvastatin (CRESTOR) 20 MG tablet, Take 1 tablet (20 mg total) by mouth daily., Disp: 90 tablet, Rfl: 3   tamsulosin (FLOMAX) 0.4 MG CAPS capsule, Take 0.4 mg by mouth daily., Disp: , Rfl:    triamcinolone cream (KENALOG) 0.1 %, Apply 1 application topically daily as needed (eczema)., Disp: , Rfl:   Cardiovascular studies:  EKG 07/06/2022: Sinus rhythm 58 bpm Possible old anteroseptal infarct Poor R wave progression  Echocardiogram 06/29/2022:  Normal LV systolic function with visual EF 60-65%. Left ventricle cavity  is normal in size. Mild concentric hypertrophy of the left ventricle.  Normal global wall motion.  Doppler evidence of grade I (impaired)  diastolic dysfunction, elevated LAP. Calculated EF 66%.  Left atrial cavity is mildly dilated at 4.2 cm.  Trileaflet aortic valve with no regurgitation. Mild aortic valve leaflet  calcification.  Structurally normal tricuspid valve with trace regurgitation. No evidence  of pulmonary hypertension.   MRI abdomen 02/2021: Benign left adrenal adenoma. Benign right adrenal myelolipoma.  Renal artery duplex 03/26/2018: 1. No Doppler ultrasound evidence of hemodynamically significant renal artery stenosis. If there is continued clinical concern, renal MRA (lower radiation risk, can be performed noncontrast in the setting of renal dysfunction) and CTA ( higher spatial resolution) represent more accurate studies, which are additionally more sensitive to the detection of duplicated renal arteries. 2. 3.7 cm echogenic right hepatic lesion, possibly benign hemangioma but incompletely characterized. Recommend liver protocol MRI or CT with contrast for possibly diagnostic characterization.  Recent labs: 07/20/2022: Glucose 148, BUN/Cr 20/1.37. EGFR 55. K 5.2.  06/27/2022: Glucose 141, BUN/Cr 24/1.31. EGFR 58. Na/K 138/5.8. Rest of the CMP normal H/H 12/35. MCV 91. Platelets 185 HbA1C 7.5% Chol 159, TG 344, HDL 33, LDL 71 TSH NA  11/04/2021: Glucose 125, BUN/Cr 15/1.14. EGFR 68. Na/K 138/4.6. Rest of the CMP normal HbA1C 7.3% Chol 130, TG 189, HDL 36, LDL 63  Review of Systems  Cardiovascular:  Negative for chest pain, dyspnea on exertion, leg swelling, palpitations and syncope.  Vitals:   10/12/22 0921  BP: (!) 149/78  Pulse: (!) 55  Resp: 16  SpO2: 96%      Body mass index is 28.98 kg/m. Filed Weights   10/12/22 0921  Weight: 185 lb (83.9 kg)     Objective:   Physical Exam Vitals and nursing note reviewed.  Constitutional:      General: He is not in acute distress. Neck:     Vascular: No JVD.  Cardiovascular:     Rate  and Rhythm: Normal rate and regular rhythm.     Pulses: Intact distal pulses.     Heart sounds: Murmur heard.     Harsh midsystolic murmur is present with a grade of 2/6 at the upper right sternal border radiating to the neck.  Pulmonary:     Effort: Pulmonary effort is normal.     Breath sounds: Normal breath sounds. No wheezing or rales.  Musculoskeletal:     Right lower leg: No edema.     Left lower leg: No edema.     Visit diagnoses:    ICD-10-CM   1. Resistant hypertension  I1A.0 amLODipine (NORVASC) 10 MG tablet    2. Hypertriglyceridemia  E78.1     3. Essential hypertension  I10 carvedilol (COREG) 25 MG tablet     No orders of the defined types were placed in this encounter.  Medications Discontinued During This Encounter  Medication Reason   carvedilol (COREG) 25 MG tablet Reorder   amLODipine (NORVASC) 5 MG tablet Reorder        Assessment & Recommendations:   71 y.o. Caucasian male with resistant hypertension, type 2 DM, leg edema, h/o HCTZ induced hyponatremia.  Hypertension: Well controlled, with numbers even lower at home. K 5.8 in 06/2022. Therefore, stopped spironolactone. EKG with no hyperkalemia changes. K down to 5.2 in 07/2022. BP now uncontrolled. Increased amlodipine to 10 mg daily. Continue carvedilol 25 mg bid, olmesartan 40 mg daily, amlodipine 5 mg daily.   Hypertriglyceridemia: TG elevated along with A1C. Change simvastatin to Crestor. Continue diabetes management as per PCP Has upcoming lipid panel with PCP.  F/u in 3 months   Joseph Mormon, MD Pager: [phone] Office: [phone]

## 2023-01-11 ENCOUNTER — Encounter: Payer: Self-pay | Admitting: Cardiology

## 2023-01-11 ENCOUNTER — Ambulatory Visit: Payer: Medicare Other | Admitting: Cardiology

## 2023-01-11 VITALS — BP 122/70 | HR 54 | Resp 16 | Ht 67.0 in | Wt 174.0 lb

## 2023-01-11 DIAGNOSIS — E782 Mixed hyperlipidemia: Secondary | ICD-10-CM | POA: Insufficient documentation

## 2023-01-11 DIAGNOSIS — I1A0 Resistant hypertension: Secondary | ICD-10-CM

## 2023-01-11 NOTE — Progress Notes (Signed)
Patient referred by [name],* for leg edema  Subjective:   Joseph Steele, male    DOB: [birth year], 72 y.o.   MRN: [id number]   Chief Complaint  Patient presents with   Hypertension   Follow-up    3 Month    72 y.o. Caucasian male with hypertension, type 2 DM, leg edema, h/o HCTZ induced hyponatremia, benign adrenal adenoma  Blood pressure now very well controlled. Reviewed recent test results with the patient, details below.     Current Outpatient Medications:    amLODipine (NORVASC) 10 MG tablet, Take 1 tablet (10 mg total) by mouth daily., Disp: 90 tablet, Rfl: 3   carvedilol (COREG) 25 MG tablet, Take 1 tablet (25 mg total) by mouth 2 (two) times daily with a meal., Disp: 180 tablet, Rfl: 3   famotidine (PEPCID) 20 MG tablet, Take 20 mg by mouth 2 (two) times daily., Disp: , Rfl:    fluticasone (FLONASE) 50 MCG/ACT nasal spray, Place 2 sprays into both nostrils every morning., Disp: , Rfl:    metFORMIN (GLUCOPHAGE) 500 MG tablet, Take 500 mg by mouth 2 (two) times daily with a meal. Per patient this is how he is taking it, Disp: , Rfl:    Multiple Vitamin (MULTIVITAMIN) tablet, Take 1 tablet by mouth daily., Disp: , Rfl:    olmesartan (BENICAR) 40 MG tablet, Take 1 tablet (40 mg total) by mouth every other day., Disp: 90 tablet, Rfl: 3   rosuvastatin (CRESTOR) 20 MG tablet, Take 1 tablet (20 mg total) by mouth daily., Disp: 90 tablet, Rfl: 3   tamsulosin (FLOMAX) 0.4 MG CAPS capsule, Take 0.4 mg by mouth daily., Disp: , Rfl:    triamcinolone cream (KENALOG) 0.1 %, Apply 1 application topically daily as needed (eczema)., Disp: , Rfl:    Turmeric (QC TUMERIC COMPLEX PO), Take by mouth., Disp: , Rfl:   Cardiovascular studies:  EKG 01/11/2023: Sinus rhythm 53 bpm Normal EKG   Echocardiogram 06/29/2022:  Normal LV systolic function with visual EF 60-65%. Left ventricle cavity  is normal in size. Mild concentric hypertrophy of the left ventricle.  Normal  global wall motion. Doppler evidence of grade I (impaired)  diastolic dysfunction, elevated LAP. Calculated EF 66%.  Left atrial cavity is mildly dilated at 4.2 cm.  Trileaflet aortic valve with no regurgitation. Mild aortic valve leaflet  calcification.  Structurally normal tricuspid valve with trace regurgitation. No evidence  of pulmonary hypertension.   MRI abdomen 02/2021: Benign left adrenal adenoma. Benign right adrenal myelolipoma.  Renal artery duplex 03/26/2018: 1. No Doppler ultrasound evidence of hemodynamically significant renal artery stenosis. If there is continued clinical concern, renal MRA (lower radiation risk, can be performed noncontrast in the setting of renal dysfunction) and CTA ( higher spatial resolution) represent more accurate studies, which are additionally more sensitive to the detection of duplicated renal arteries. 2. 3.7 cm echogenic right hepatic lesion, possibly benign hemangioma but incompletely characterized. Recommend liver protocol MRI or CT with contrast for possibly diagnostic characterization.  Recent labs: 10/12/2022: Glucose 197, BUN/Cr 16/1.28. K 4.6  07/20/2022: Glucose 148, BUN/Cr 20/1.37. EGFR 55. K 5.2.  06/27/2022: Glucose 141, BUN/Cr 24/1.31. EGFR 58. Na/K 138/5.8. Rest of the CMP normal H/H 12/35. MCV 91. Platelets 185 HbA1C 7.5% Chol 159, TG 344, HDL 33, LDL 71 TSH NA  11/04/2021: Glucose 125, BUN/Cr 15/1.14. EGFR 68. Na/K 138/4.6. Rest of the CMP normal HbA1C 7.3% Chol 130, TG 189, HDL 36, LDL 63  Review of Systems  Cardiovascular:  Negative for chest pain, dyspnea on exertion, leg swelling, palpitations and syncope.        Vitals:   01/11/23 1141  BP: 122/70  Pulse: (!) 54  Resp: 16  SpO2: 96%      Body mass index is 27.25 kg/m. Filed Weights   01/11/23 1141  Weight: 174 lb (78.9 kg)     Objective:   Physical Exam Vitals and nursing note reviewed.  Constitutional:      General: He is not in acute  distress. Neck:     Vascular: No JVD.  Cardiovascular:     Rate and Rhythm: Normal rate and regular rhythm.     Pulses: Intact distal pulses.     Heart sounds: Murmur heard.     Harsh midsystolic murmur is present with a grade of 2/6 at the upper right sternal border radiating to the neck.  Pulmonary:     Effort: Pulmonary effort is normal.     Breath sounds: Normal breath sounds. No wheezing or rales.  Musculoskeletal:     Right lower leg: No edema.     Left lower leg: No edema.     Visit diagnoses:    ICD-10-CM   1. Resistant hypertension  I1A.0 EKG 12-Lead    2. Mixed hyperlipidemia  E78.2      Orders Placed This Encounter  Procedures   EKG 12-Lead       Assessment & Recommendations:   72 y.o. Caucasian male with resistant hypertension, type 2 DM, leg edema, h/o HCTZ induced hyponatremia.  Hypertension: K 5.8 in 06/2022. Therefore, stopped spironolactone. EKG with no hyperkalemia changes. K down to 5.2 in 07/2022. K 4.6 (09/2022).5.8 in 06/2022.  BP now well controlled.  Continue amlodipine to 10 mg daily, carvedilol 25 mg bid, olmesartan 40 mg daily.  Hypertriglyceridemia: TG elevated along with A1C. Continue Crestor. Continue diabetes management as per PCP  F/u in 1 year    [name], MD Pager: [phone] Office: [phone]

## 2023-01-13 IMAGING — MG DIGITAL DIAGNOSTIC BILAT W/ TOMO W/ CAD
6 of 10 series · 6 of 30 positions shown · non-contrast
Comparison: None

ACR Breast Density Category a: The breast tissue is almost entirely
fatty.

CLINICAL DATA: 69-year-old male with RETROAREOLAR LEFT breast
fullness and tenderness for several months.

EXAM:
DIGITAL DIAGNOSTIC BILATERAL MAMMOGRAM WITH CAD AND TOMO

[L TAN synth-2D]
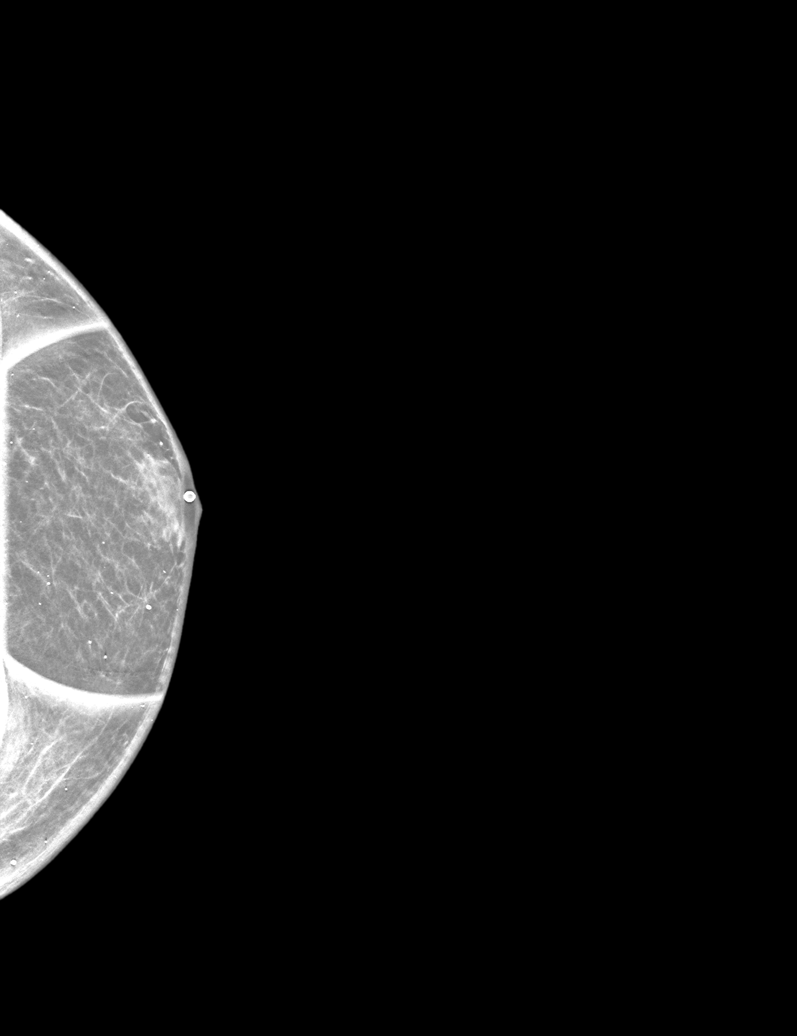

[R CC synth-2D]
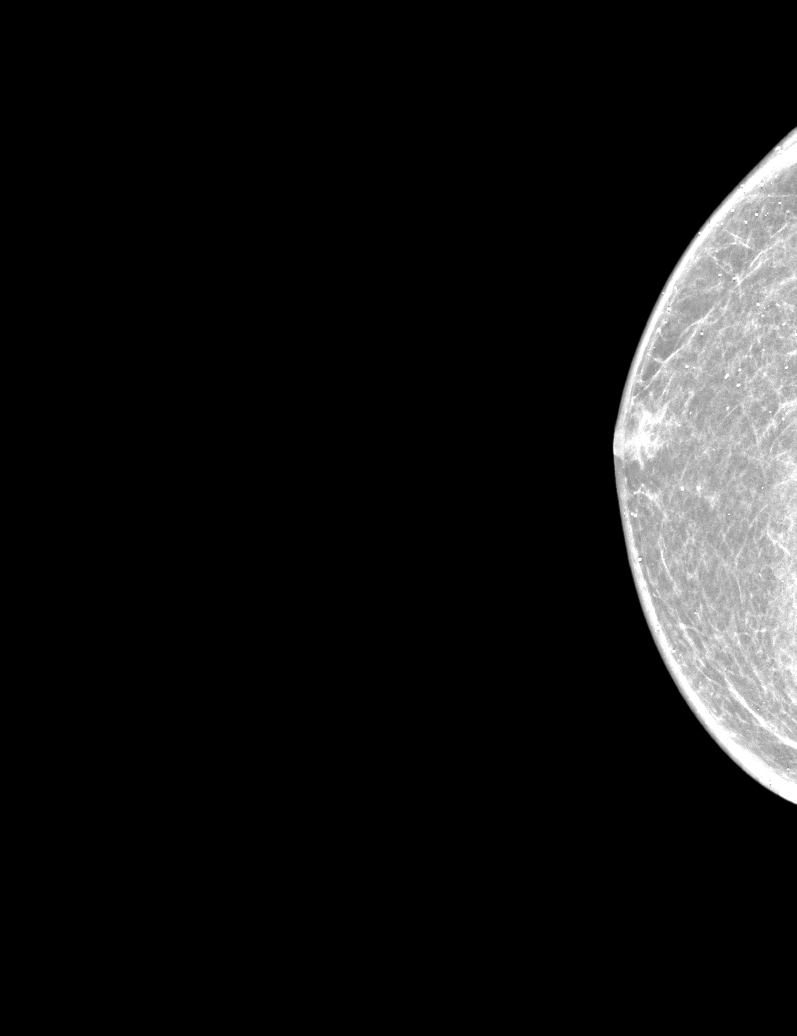

[R MLO synth-2D]
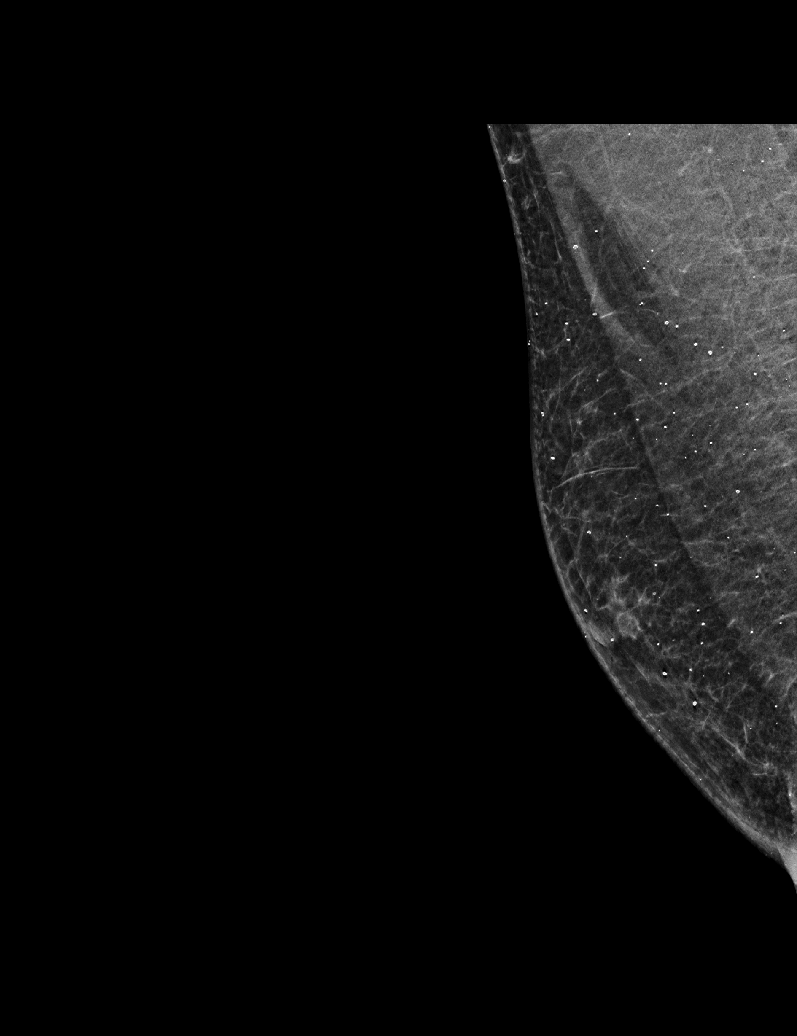

[L MLO synth-2D]
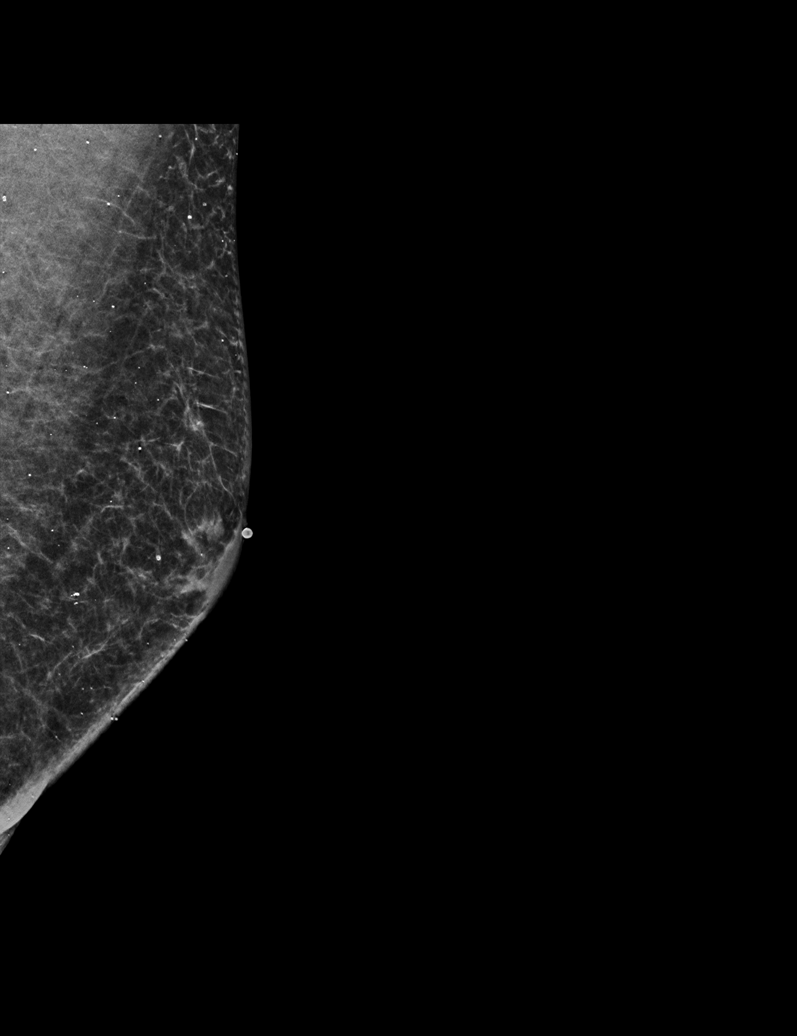

[L CC synth-2D]
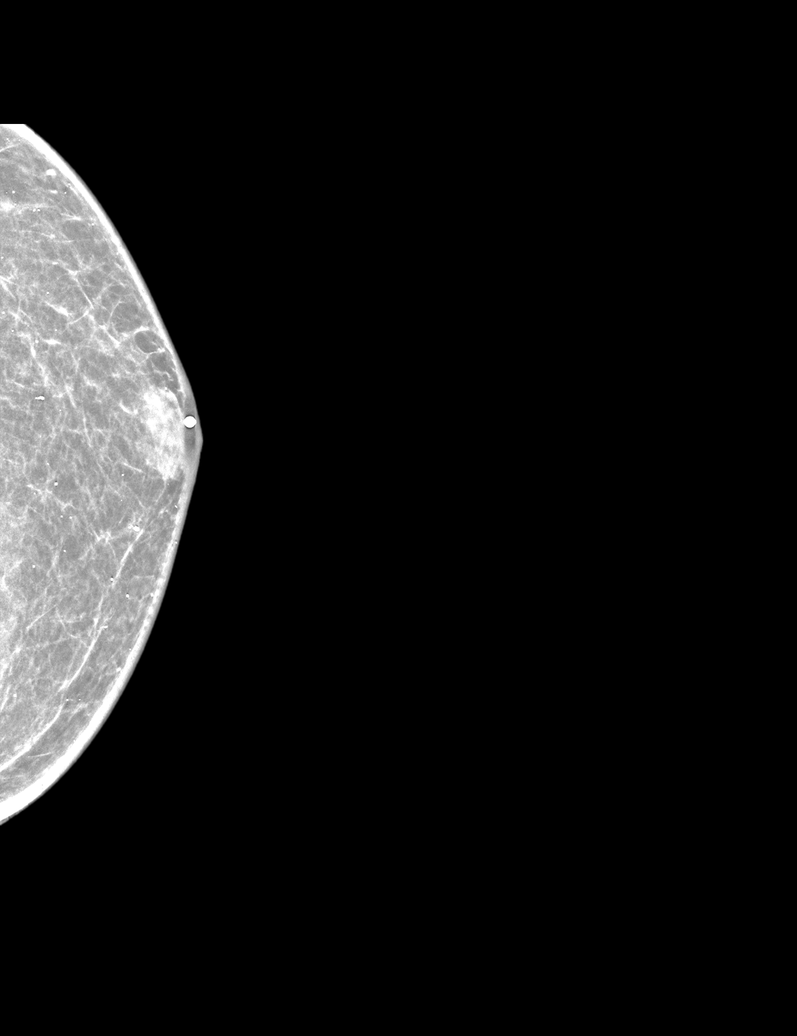

[L MLO tomo · tomo slice 23/46.0]
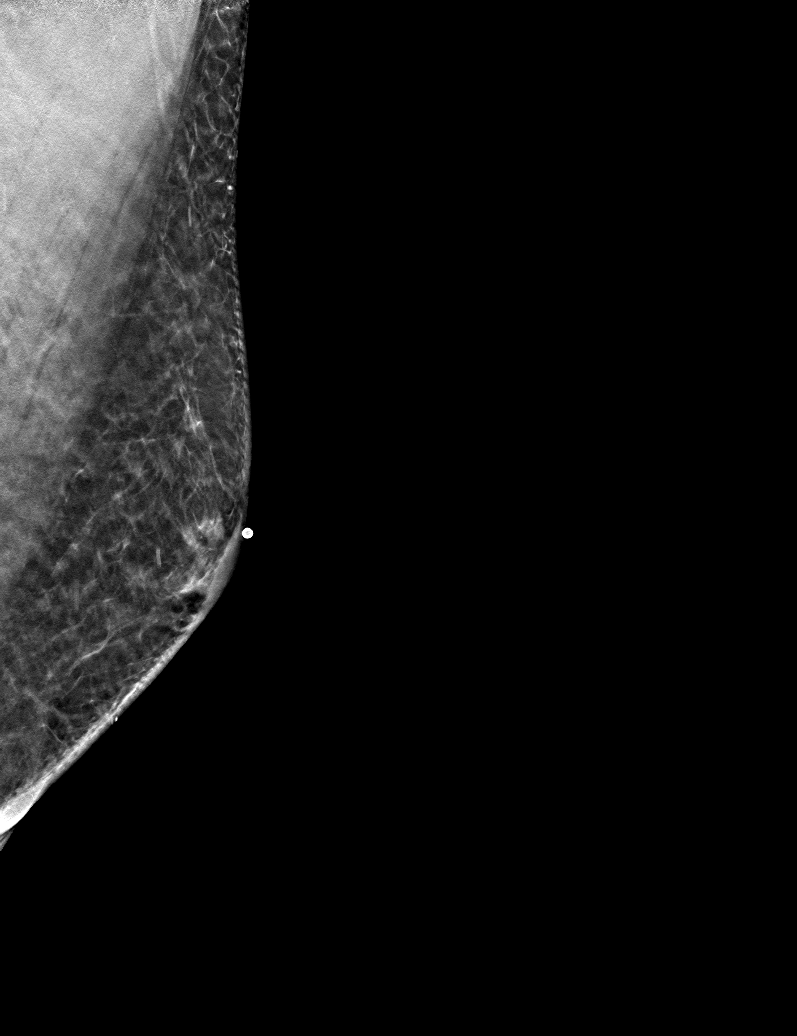

[6 of 30 positions shown; findings below may reference images not displayed]

FINDINGS: 2D/3D full field views of both breasts and spot compression view of
the LEFT breast demonstrate bilateral gynecomastia, minimal on the
RIGHT and mild on the LEFT.

No suspicious mass, distortion or worrisome calcifications are noted
within either breast.

Mammographic images were processed with CAD.

Physical examination demonstrates mild RETROAREOLAR LEFT breast
thickening without discrete palpable mass.
IMPRESSION: 1. Mild LEFT gynecomastia and minimal RIGHT gynecomastia. No further
imaging follow-up recommended.
2. No mammographic evidence of breast malignancy.

RECOMMENDATION:
Consider clinical follow-up as indicated. Any further workup should
be based on clinical grounds.

I have discussed the findings, causes of gynecomastia and
recommendations with the patient. If applicable, a reminder letter
will be sent to the patient regarding the next appointment.

BI-RADS CATEGORY  2: Benign.

## 2023-05-16 ENCOUNTER — Other Ambulatory Visit: Payer: Self-pay | Admitting: Cardiology

## 2023-08-01 ENCOUNTER — Encounter: Payer: Self-pay | Admitting: Cardiology

## 2023-09-02 ENCOUNTER — Other Ambulatory Visit: Payer: Self-pay | Admitting: Cardiology

## 2023-09-02 DIAGNOSIS — I1A0 Resistant hypertension: Secondary | ICD-10-CM

## 2023-09-02 DIAGNOSIS — I10 Essential (primary) hypertension: Secondary | ICD-10-CM

## 2023-12-01 ENCOUNTER — Other Ambulatory Visit: Payer: Self-pay | Admitting: Cardiology

## 2023-12-01 DIAGNOSIS — I10 Essential (primary) hypertension: Secondary | ICD-10-CM

## 2023-12-09 ENCOUNTER — Other Ambulatory Visit: Payer: Self-pay | Admitting: Cardiology

## 2023-12-09 DIAGNOSIS — I1A0 Resistant hypertension: Secondary | ICD-10-CM

## 2024-01-12 ENCOUNTER — Encounter: Payer: Self-pay | Admitting: Cardiology

## 2024-01-12 ENCOUNTER — Other Ambulatory Visit: Payer: Self-pay

## 2024-01-12 ENCOUNTER — Ambulatory Visit: Payer: Medicare Other | Attending: Cardiology | Admitting: Cardiology

## 2024-01-12 VITALS — BP 115/58 | HR 64 | Resp 16 | Ht 67.0 in | Wt 172.0 lb

## 2024-01-12 DIAGNOSIS — E782 Mixed hyperlipidemia: Secondary | ICD-10-CM | POA: Diagnosis not present

## 2024-01-12 DIAGNOSIS — I10 Essential (primary) hypertension: Secondary | ICD-10-CM | POA: Diagnosis not present

## 2024-01-12 DIAGNOSIS — Z79899 Other long term (current) drug therapy: Secondary | ICD-10-CM | POA: Diagnosis not present

## 2024-01-12 MED ORDER — FENOFIBRATE 48 MG PO TABS: 48.0000 mg | ORAL_TABLET | Freq: Every day | ORAL | 3 refills | Status: DC

## 2024-01-12 NOTE — Patient Instructions (Addendum)
 Medication Instructions:  Fenofibrate 48 mg daily  *If you need a refill on your cardiac medications before your next appointment, please call your pharmacy*   Lab Work: Your physician recommends that you return for lab work in May 2025. Fasting Lipid panel . Testing/Procedures: NONE ordered at this time of appointment   Follow-Up: At [facility], you and your health needs are our priority.  As part of our continuing mission to provide you with exceptional heart care, we have created designated Provider Care Teams.  These Care Teams include your primary Cardiologist (physician) and Advanced Practice Providers (APPs -  Physician Assistants and Nurse Practitioners) who all work together to provide you with the care you need, when you need it.  We recommend signing up for the patient portal called "MyChart".  Sign up information is provided on this After Visit Summary.  MyChart is used to connect with patients for Virtual Visits (Telemedicine).  Patients are able to view lab/test results, encounter notes, upcoming appointments, etc.  Non-urgent messages can be sent to your provider as well.   To learn more about what you can do with MyChart, go to [email or web address].    Your next appointment:   1 year(s)  Provider:   Dr. Arnell Sieving

## 2024-01-12 NOTE — Progress Notes (Signed)
 Cardiology Office Note:  .   Date:  01/12/2024  ID:  Joseph Steele, DOB February 07, 1951, MRN [id number] PCP: Lorenda Ishihara, MD  [facility] Providers Cardiologist:  Truett Mainland, MD PCP: Lorenda Ishihara, MD  Chief Complaint  Patient presents with   Follow-up    1year   Hypertension      History of Present Illness: Joseph Steele    Joseph Steele is a 73 y.o. male with hypertension, type 2 DM, leg edema, h/o HCTZ induced hyponatremia, benign adrenal adenoma.  Patient is doing well without any cardiac complaints.  Recently, his carvedilol dose was reduced from 25 mg twice daily to 12.5 mg twice daily by his PCP due to low diastolic blood pressure.  He is tolerating this well.  Reviewed recent lipid panel and other labs results with the patient, details below.    Vitals:   01/12/24 0914  BP: (!) 115/58  Pulse: 64  Resp: 16  SpO2: 94%     ROS:  Review of Systems  Cardiovascular:  Negative for chest pain, dyspnea on exertion, leg swelling, palpitations and syncope.     Studies Reviewed: Joseph Steele        EKG 01/12/2024: Normal sinus rhythm Inferior infarct , age undetermined Cannot rule out Anterior infarct , age undetermined Personally compared with previous EKG on 01/11/2023.  No significant change noted.  Independently interpreted 06/2023: Chol 148, TG 282, HDL 34, LDL 69 HbA1C 7.9% Hb 12.9 Cr 1.56  Echocardiogram 06/29/2022:  Normal LV systolic function with visual EF 60-65%. Left ventricle cavity  is normal in size. Mild concentric hypertrophy of the left ventricle.  Normal global wall motion. Doppler evidence of grade I (impaired)  diastolic dysfunction, elevated LAP. Calculated EF 66%.  Left atrial cavity is mildly dilated at 4.2 cm.  Trileaflet aortic valve with no regurgitation. Mild aortic valve leaflet  calcification.  Structurally normal tricuspid valve with trace regurgitation. No evidence  of pulmonary hypertension.      Physical Exam:    Physical Exam Vitals and nursing note reviewed.  Constitutional:      General: He is not in acute distress. Neck:     Vascular: No JVD.  Cardiovascular:     Rate and Rhythm: Normal rate and regular rhythm.     Heart sounds: Normal heart sounds. No murmur heard. Pulmonary:     Effort: Pulmonary effort is normal.     Breath sounds: Normal breath sounds. No wheezing or rales.  Musculoskeletal:     Right lower leg: No edema.     Left lower leg: No edema.      VISIT DIAGNOSES:   ICD-10-CM   1. Essential hypertension  I10 EKG 12-Lead    Lipid panel    2. Medication management  Z79.899 Lipid panel    3. Mixed hyperlipidemia  E78.2        ASSESSMENT AND PLAN: .    Joseph Steele is a 73 y.o. male with resistant hypertension, type 2 DM, leg edema, h/o HCTZ induced hyponatremia.   Hypertension: Well-controlled. Continue amlodipine to 10 mg daily, carvedilol 12.5 mg bid, olmesartan 40 mg daily. Of note, patient has had significant hyperkalemia in the past while on spironolactone.   Hypertriglyceridemia: Triglycerides remain elevated.  I suspect this is over NP on his uncontrolled diabetes with A1c of 7.9%. Continue Crestor 20 mg daily.  In addition, started fenofibrate 48 mg daily. Repeat fasting lipid panel in 3 months.   Meds ordered this encounter  Medications  fenofibrate (TRICOR) 48 MG tablet    Sig: Take 1 tablet (48 mg total) by mouth daily.    Dispense:  90 tablet    Refill:  3     F/u in 1 year  Signed, Elder Negus, MD

## 2024-02-07 ENCOUNTER — Other Ambulatory Visit: Payer: Self-pay | Admitting: Cardiology

## 2024-03-08 ENCOUNTER — Other Ambulatory Visit: Payer: Self-pay | Admitting: Cardiology

## 2024-03-08 DIAGNOSIS — I1A0 Resistant hypertension: Secondary | ICD-10-CM

## 2024-03-21 LAB — LIPID PANEL
Chol/HDL Ratio: 3.8 ratio (ref 0.0–5.0)
Cholesterol, Total: 120 mg/dL (ref 100–199)
HDL: 32 mg/dL — ABNORMAL LOW (ref >39)
LDL Chol Calc (NIH): 52 mg/dL (ref 0–99)
Triglycerides: 225 mg/dL — ABNORMAL HIGH (ref 0–149)
VLDL Cholesterol Cal: 36 mg/dL (ref 5–40)

## 2024-03-22 ENCOUNTER — Encounter: Payer: Self-pay | Admitting: Cardiology

## 2024-04-10 ENCOUNTER — Other Ambulatory Visit: Payer: Self-pay | Admitting: Cardiology

## 2024-04-10 DIAGNOSIS — I1A0 Resistant hypertension: Secondary | ICD-10-CM

## 2024-05-17 ENCOUNTER — Other Ambulatory Visit: Payer: Self-pay | Admitting: Cardiology

## 2024-11-05 ENCOUNTER — Other Ambulatory Visit: Payer: Self-pay

## 2024-11-05 MED ORDER — FENOFIBRATE 48 MG PO TABS: 48.0000 mg | ORAL_TABLET | Freq: Every day | ORAL | 0 refills | Status: AC

## 2024-11-22 ENCOUNTER — Other Ambulatory Visit: Payer: Self-pay | Admitting: Cardiology
